# Patient Record
Sex: Female | Born: 1955 | Race: White | Hispanic: No | Marital: Married | Smoking: Never smoker
Health system: Southern US, Community
[De-identification: ages and names within clinical notes are randomized; demographics above are authoritative.]

## PROBLEM LIST (undated history)

## (undated) DIAGNOSIS — E785 Hyperlipidemia, unspecified: Secondary | ICD-10-CM

## (undated) DIAGNOSIS — L299 Pruritus, unspecified: Secondary | ICD-10-CM

## (undated) DIAGNOSIS — Z8614 Personal history of Methicillin resistant Staphylococcus aureus infection: Secondary | ICD-10-CM

## (undated) DIAGNOSIS — O021 Missed abortion: Secondary | ICD-10-CM

## (undated) DIAGNOSIS — L439 Lichen planus, unspecified: Secondary | ICD-10-CM

## (undated) DIAGNOSIS — M20099 Other deformity of finger(s), unspecified finger(s): Secondary | ICD-10-CM

## (undated) DIAGNOSIS — R7881 Bacteremia: Secondary | ICD-10-CM

## (undated) DIAGNOSIS — K449 Diaphragmatic hernia without obstruction or gangrene: Secondary | ICD-10-CM

## (undated) DIAGNOSIS — M1990 Unspecified osteoarthritis, unspecified site: Secondary | ICD-10-CM

## (undated) DIAGNOSIS — L03119 Cellulitis of unspecified part of limb: Secondary | ICD-10-CM

## (undated) HISTORY — DX: Personal history of Methicillin resistant Staphylococcus aureus infection: Z86.14

## (undated) HISTORY — DX: Cellulitis of unspecified part of limb: L03.119

## (undated) HISTORY — DX: Other deformity of finger(s), unspecified finger(s): M20.099

## (undated) HISTORY — DX: Pruritus, unspecified: L29.9

## (undated) HISTORY — PX: WISDOM TOOTH EXTRACTION: SHX21

## (undated) HISTORY — DX: Lichen planus, unspecified: L43.9

## (undated) HISTORY — DX: Diaphragmatic hernia without obstruction or gangrene: K44.9

## (undated) HISTORY — DX: Bacteremia: R78.81

## (undated) HISTORY — PX: DILATION AND CURETTAGE OF UTERUS: SHX78

## (undated) HISTORY — DX: Missed abortion: O02.1

## (undated) HISTORY — PX: CHOLECYSTECTOMY: SHX55

## (undated) HISTORY — PX: OTHER SURGICAL HISTORY: SHX169

---

## 2013-02-08 ENCOUNTER — Other Ambulatory Visit (HOSPITAL_COMMUNITY)
Admission: RE | Admit: 2013-02-08 | Discharge: 2013-02-08 | Disposition: A | Discharge status: Home / Self Care | Payer: Managed Care, Other (non HMO) | Source: Ambulatory Visit | Attending: Gynecology | Admitting: Gynecology

## 2013-02-08 ENCOUNTER — Encounter: Payer: Self-pay | Admitting: Gynecology

## 2013-02-08 ENCOUNTER — Ambulatory Visit (INDEPENDENT_AMBULATORY_CARE_PROVIDER_SITE_OTHER): Payer: Managed Care, Other (non HMO) | Admitting: Gynecology

## 2013-02-08 VITALS — BP 126/80 | Ht 66.0 in | Wt 180.0 lb

## 2013-02-08 DIAGNOSIS — Z01419 Encounter for gynecological examination (general) (routine) without abnormal findings: Secondary | ICD-10-CM

## 2013-02-08 DIAGNOSIS — N951 Menopausal and female climacteric states: Secondary | ICD-10-CM

## 2013-02-08 DIAGNOSIS — L900 Lichen sclerosus et atrophicus: Secondary | ICD-10-CM

## 2013-02-08 DIAGNOSIS — L940 Localized scleroderma [morphea]: Secondary | ICD-10-CM

## 2013-02-08 DIAGNOSIS — Z1151 Encounter for screening for human papillomavirus (HPV): Secondary | ICD-10-CM | POA: Insufficient documentation

## 2013-02-08 DIAGNOSIS — Z780 Asymptomatic menopausal state: Secondary | ICD-10-CM

## 2013-02-08 MED ORDER — CLOBETASOL PROPIONATE 0.05 % EX CREA: TOPICAL_CREAM | Freq: Two times a day (BID) | CUTANEOUS | Status: DC

## 2013-02-08 NOTE — Progress Notes (Signed)
Diane Price 04-13-1956 [id number]   History:    57 y.o.  for annual gyn exam new patient to the practice who moved here from [place]. Patient stated that she was being evaluated in [place] for recurrent vulvar sores. Patient several years ago was treated for MRSA infection in her back. She stated her gynecological examination was over a year ago. She states that she has always had normal Pap smears. Mammogram was normal over a year ago although she has history of dense breast. Her colonoscopy was 3 years ago reportedly normal. No prior bone density study. Her PCP is Dr. Darcus Austin who has been doing her lab work. Patient does not recall when she entered menopause but has never been on hormone replacement therapy. 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  Past medical history,surgical history, family history and social history were all reviewed and documented in the EPIC chart.  Gynecologic History No LMP recorded. Patient is postmenopausal. Contraception: post menopausal status Last Pap: over a year ago in [place]. Results were: patient reports that it was normal Last mammogram: greater than one year ago in [place]. Results were: normal but dense and had a right benign breast biopsy  Obstetric History OB History   Grav Para Term Preterm Abortions TAB SAB Ect Mult Living   4 1   3  3   1      # Outc Date GA Lbr Len/2nd Wgt Sex Del Anes PTL Lv   1 PAR            2 SAB            3 SAB            4 SAB                ROS: A ROS was performed and pertinent positives and negatives are included in the history.  GENERAL: No fevers or chills. HEENT: No change in vision, no earache, sore throat or sinus congestion. NECK: No pain or stiffness. CARDIOVASCULAR: No chest pain or pressure. No palpitations. PULMONARY: No shortness of breath, cough or wheeze. GASTROINTESTINAL: No abdominal pain, nausea,  vomiting or diarrhea, melena or bright red blood per rectum. GENITOURINARY: No urinary frequency, urgency, hesitancy or dysuria. MUSCULOSKELETAL: No joint or muscle pain, no back pain, no recent trauma. DERMATOLOGIC: irritated paper cut like areas lower part of the vagina and tenderENDOCRINE: No polyuria, polydipsia, no heat or cold intolerance. No recent change in weight. HEMATOLOGICAL: No anemia or easy bruising or bleeding. NEUROLOGIC: No headache, seizures, numbness, tingling or weakness. PSYCHIATRIC: No depression, no loss of interest in normal activity or change in sleep pattern.     Exam: chaperone present  BP 126/80  Ht 5\' 6"  (1.676 m)  Wt 180 lb (81.647 kg)  BMI 29.07 kg/m2  Body mass index is 29.07 kg/(m^2).  General appearance : Well developed well nourished female. No acute distress HEENT: Neck supple, trachea midline, no carotid bruits, no thyroidmegaly Lungs: Clear to auscultation, no rhonchi or wheezes, or rib retractions  Heart: Regular rate and rhythm, no murmurs or gallops Breast:Examined in sitting and supine position were symmetrical in appearance, no palpable masses or tenderness,  no skin retraction, no nipple inversion, no nipple discharge, no skin discoloration, no axillary or supraclavicular lymphadenopathy Abdomen: no palpable masses or tenderness, no rebound or guarding Extremities: no edema or skin discoloration or tenderness  Pelvic:  Bartholin,  Urethra, Skene Glands: Within normal limits.leukoplakic excoriated area of the fourchette suspicious for lichen sclerosis             Vagina: No gross lesions or discharge  Cervix: No gross lesions or discharge  Uterus  Small anteverted, normal size, shape and consistency, non-tender and mobile  Adnexa  Without masses or tenderness  Anus and perineum  normal   Rectovaginal  normal sphincter tone without palpated masses or tenderness             Hemoccult Hemoccult card provided     Assessment/Plan:  57 y.o. 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 Patient stated she has had biopsies of the area before. She is going to be placed on clobetasol 0.05% to apply twice a day for one week then 3 times a week and then followup to see me in one month. Hemoccult cards were provided for her to cement in the office. She will schedule a bone density as well as mammogram. Pap smear was done today. The new guidelines were discussed. We discussed importance of calcium and vitamin D in regular exercise for osteoporosis prevention.    Ok Edwards MD, 5:18 PM 02/08/2013

## 2013-02-08 NOTE — Patient Instructions (Addendum)
Tetanus, Diphtheria, Pertussis (Tdap) Vaccine What You Need to Know WHY GET VACCINATED? Tetanus, diphtheria and pertussis can be very serious diseases, even for adolescents and adults. Tdap vaccine can protect us from these diseases. TETANUS (Lockjaw) causes painful muscle tightening and stiffness, usually all over the body.  It can lead to tightening of muscles in the head and neck so you can't open your mouth, swallow, or sometimes even breathe. Tetanus kills about 1 out of 5 people who are infected. DIPHTHERIA can cause a thick coating to form in the back of the throat.  It can lead to breathing problems, paralysis, heart failure, and death. PERTUSSIS (Whooping Cough) causes severe coughing spells, which can cause difficulty breathing, vomiting and disturbed sleep.  It can also lead to weight loss, incontinence, and rib fractures. Up to 2 in 100 adolescents and 5 in 100 adults with pertussis are hospitalized or have complications, which could include pneumonia and death. These diseases are caused by bacteria. Diphtheria and pertussis are spread from person to person through coughing or sneezing. Tetanus enters the body through cuts, scratches, or wounds. Before vaccines, the [place] saw as many as 200,000 cases a year of diphtheria and pertussis, and hundreds of cases of tetanus. Since vaccination began, tetanus and diphtheria have dropped by about 99% and pertussis by about 80%. TDAP VACCINE Tdap vaccine can protect adolescents and adults from tetanus, diphtheria, and pertussis. One dose of Tdap is routinely given at age 11 or 12. People who did not get Tdap at that age should get it as soon as possible. Tdap is especially important for health care professionals and anyone having close contact with a baby younger than 12 months. Pregnant women should get a dose of Tdap during every pregnancy, to protect the newborn from pertussis. Infants are most at risk for severe, life-threatening  complications from pertussis. A similar vaccine, called Td, protects from tetanus and diphtheria, but not pertussis. A Td booster should be given every 10 years. Tdap may be given as one of these boosters if you have not already gotten a dose. Tdap may also be given after a severe cut or burn to prevent tetanus infection. Your doctor can give you more information. Tdap may safely be given at the same time as other vaccines. SOME PEOPLE SHOULD NOT GET THIS VACCINE  If you ever had a life-threatening allergic reaction after a dose of any tetanus, diphtheria, or pertussis containing vaccine, OR if you have a severe allergy to any part of this vaccine, you should not get Tdap. Tell your doctor if you have any severe allergies.  If you had a coma, or long or multiple seizures within 7 days after a childhood dose of DTP or DTaP, you should not get Tdap, unless a cause other than the vaccine was found. You can still get Td.  Talk to your doctor if you:  have epilepsy or another nervous system problem,  had severe pain or swelling after any vaccine containing diphtheria, tetanus or pertussis,  ever had Guillain-Barr Syndrome (GBS),  aren't feeling well on the day the shot is scheduled. RISKS OF A VACCINE REACTION With any medicine, including vaccines, there is a chance of side effects. These are usually mild and go away on their own, but serious reactions are also possible. Brief fainting spells can follow a vaccination, leading to injuries from falling. Sitting or lying down for about 15 minutes can help prevent these. Tell your doctor if you feel dizzy or light-headed, or   have vision changes or ringing in the ears. Mild problems following Tdap (Did not interfere with activities)  Pain where the shot was given (about 3 in 4 adolescents or 2 in 3 adults)  Redness or swelling where the shot was given (about 1 person in 5)  Mild fever of at least 100.67F (up to about 1 in 25 adolescents or 1 in  100 adults)  Headache (about 3 or 4 people in 10)  Tiredness (about 1 person in 3 or 4)  Nausea, vomiting, diarrhea, stomach ache (up to 1 in 4 adolescents or 1 in 10 adults)  Chills, body aches, sore joints, rash, swollen glands (uncommon) Moderate problems following Tdap (Interfered with activities, but did not require medical attention)  Pain where the shot was given (about 1 in 5 adolescents or 1 in 100 adults)  Redness or swelling where the shot was given (up to about 1 in 16 adolescents or 1 in 25 adults)  Fever over 102F (about 1 in 100 adolescents or 1 in 250 adults)  Headache (about 3 in 20 adolescents or 1 in 10 adults)  Nausea, vomiting, diarrhea, stomach ache (up to 1 or 3 people in 100)  Swelling of the entire arm where the shot was given (up to about 3 in 100). Severe problems following Tdap (Unable to perform usual activities, required medical attention)  Swelling, severe pain, bleeding and redness in the arm where the shot was given (rare). A severe allergic reaction could occur after any vaccine (estimated less than 1 in a million doses). WHAT IF THERE IS A SERIOUS REACTION? What should I look for?  Look for anything that concerns you, such as signs of a severe allergic reaction, very high fever, or behavior changes. Signs of a severe allergic reaction can include hives, swelling of the face and throat, difficulty breathing, a fast heartbeat, dizziness, and weakness. These would start a few minutes to a few hours after the vaccination. What should I do?  If you think it is a severe allergic reaction or other emergency that can't wait, call 9-1-1 or get the person to the nearest hospital. Otherwise, call your doctor.  Afterward, the reaction should be reported to the "Vaccine Adverse Event Reporting System" (VAERS). Your doctor might file this report, or you can do it yourself through the VAERS web site at www.vaers.LAgents.no, or by calling 1-343-547-6910. VAERS is  only for reporting reactions. They do not give medical advice.  THE NATIONAL VACCINE INJURY COMPENSATION PROGRAM The National Vaccine Injury Compensation Program (VICP) is a federal program that was created to compensate people who may have been injured by certain vaccines. Persons who believe they may have been injured by a vaccine can learn about the program and about filing a claim by calling 1-(867)198-2696 or visiting the VICP website at SpiritualWord.at. HOW CAN I LEARN MORE?  Ask your doctor.  Call your local or state health department.  Contact the Centers for Disease Control and Prevention (CDC):  Call 719-878-8993 or visit CDC's website at PicCapture.uy. CDC Tdap Vaccine VIS (11/14/11) Document Released: 12/24/2011 Document Revised: 03/18/2012 Document Reviewed: 12/24/2011 [employer] Patient Information 2014 [place], [place]. Lichen Sclerosus Lichen sclerosus is a skin problem. It can happen on any part of the body, but it commonly involves the anal or genital areas. Lichen sclerosus is not an infection or a fungus. Girls and women are more commonly affected than boys and men. CAUSES The cause is not known. It could be the result of an overactive immune  system or a lack of certain hormones. Lichen sclerosus is not passed from one person to another (not contagious). SYMPTOMS Your skin may have:  Thin, wrinkled, white areas.  Thickened white areas.  Red and swollen patches.  Tears or cracks.  Bruising.  Blood blisters.  Severe itching. You may also have pain, itching, or burning with urination. Constipation is also common in people with lichen sclerosus. DIAGNOSIS Your caregiver will do a physical exam. Sometimes, a tissue sample (biopsy) may be sent for testing. TREATMENT Treatment may involve putting a thin layer of medicated cream (topical steroid) over the areas with lichen sclerosus. Use the cream only as directed by your caregiver.  HOME CARE  INSTRUCTIONS  Only take over-the-counter or prescription medicines as directed by your caregiver.  Keep the vaginal area as clean and dry as possible. SEEK MEDICAL CARE IF: You develop increasing pain, swelling, or redness. Document Released: 11/14/2010 Document Revised: 09/16/2011 Document Reviewed: 11/14/2010 [facility] Patient Information 2014 [place], [place].

## 2013-02-10 ENCOUNTER — Telehealth: Payer: Self-pay

## 2013-02-10 NOTE — Telephone Encounter (Signed)
Patient called stating that the Rx Dr. Glenetta Hew prescribed was not available at her pharmacy. I called her pharmacy and they said they did not have it in stock and there was a manufacturer back order and they did not know when they would get it.  They had called around and found it for her and it has been filled about a mile away at [facility]  The pharmacist said she informed patient of this last night.  I called pt got her voice mail and told her to go ahead and get it there. It is drug of choice for her condition and it is available for her to pick up there and should go get it even if not her regular pharmacy.

## 2013-03-18 ENCOUNTER — Ambulatory Visit: Payer: Managed Care, Other (non HMO) | Admitting: Gynecology

## 2013-03-24 ENCOUNTER — Other Ambulatory Visit: Payer: Self-pay

## 2013-03-24 DIAGNOSIS — Z1231 Encounter for screening mammogram for malignant neoplasm of breast: Secondary | ICD-10-CM

## 2013-03-29 ENCOUNTER — Encounter: Payer: Self-pay | Admitting: Gynecology

## 2013-03-29 ENCOUNTER — Ambulatory Visit (INDEPENDENT_AMBULATORY_CARE_PROVIDER_SITE_OTHER): Payer: Managed Care, Other (non HMO) | Admitting: Gynecology

## 2013-03-29 DIAGNOSIS — L900 Lichen sclerosus et atrophicus: Secondary | ICD-10-CM

## 2013-03-29 DIAGNOSIS — N952 Postmenopausal atrophic vaginitis: Secondary | ICD-10-CM

## 2013-03-29 DIAGNOSIS — L940 Localized scleroderma [morphea]: Secondary | ICD-10-CM

## 2013-03-29 MED ORDER — NONFORMULARY OR COMPOUNDED ITEM: Status: DC

## 2013-03-29 MED ORDER — CLOBETASOL PROPIONATE 0.05 % EX CREA: TOPICAL_CREAM | CUTANEOUS | Status: AC

## 2013-03-29 NOTE — Patient Instructions (Addendum)
Hormone Therapy At menopause, your body begins making less estrogen and progesterone hormones. This causes the body to stop having menstrual periods. This is because estrogen and progesterone hormones control your periods and menstrual cycle. A lack of estrogen may cause symptoms such as:  Hot flushes (or hot flashes).  Vaginal dryness.  Dry skin.  Loss of sex drive.  Risk of bone loss (osteoporosis). When this happens, you may choose to take hormone therapy to get back the estrogen lost during menopause. When the hormone estrogen is given alone, it is usually referred to as ET (Estrogen Therapy). When the hormone progestin is combined with estrogen, it is generally called HT (Hormone Therapy). This was formerly known as hormone replacement therapy (HRT). Your caregiver can help you make a decision on what will be best for you. The decision to use HT seems to change often as new studies are done. Many studies do not agree on the benefits of hormone replacement therapy. LIKELY BENEFITS OF HT INCLUDE PROTECTION FROM:  Hot Flushes (also called hot flashes) - A hot flush is a sudden feeling of heat that spreads over the face and body. The skin may redden like a blush. It is connected with sweats and sleep disturbance. Women going through menopause may have hot flushes a few times a month or several times per day depending on the woman.  Osteoporosis (bone loss)- Estrogen helps guard against bone loss. After menopause, a woman's bones slowly lose calcium and become weak and brittle. As a result, bones are more likely to break. The hip, wrist, and spine are affected most often. Hormone therapy can help slow bone loss after menopause. Weight bearing exercise and taking calcium with vitamin D also can help prevent bone loss. There are also medications that your caregiver can prescribe that can help prevent osteoporosis.  Vaginal Dryness - Loss of estrogen causes changes in the vagina. Its lining may  become thin and dry. These changes can cause pain and bleeding during sexual intercourse. Dryness can also lead to infections. This can cause burning and itching. (Vaginal estrogen treatment can help relieve pain, itching, and dryness.)  Urinary Tract Infections are more common after menopause because of lack of estrogen. Some women also develop urinary incontinence because of low estrogen levels in the vagina and bladder.  Possible other benefits of estrogen include a positive effect on mood and short-term memory in women. RISKS AND COMPLICATIONS  Using estrogen alone without progesterone causes the lining of the uterus to grow. This increases the risk of lining of the uterus (endometrial) cancer. Your caregiver should give another hormone called progestin if you have a uterus.  Women who take combined (estrogen and progestin) HT appear to have an increased risk of breast cancer. The risk appears to be small, but increases throughout the time that HT is taken.  Combined therapy also makes the breast tissue slightly denser which makes it harder to read mammograms (breast X-rays).  Combined, estrogen and progesterone therapy can be taken together every day, in which case there may be spotting of blood. HT therapy can be taken cyclically in which case you will have menstrual periods. Cyclically means HT is taken for a set amount of days, then not taken, then this process is repeated.  HT may increase the risk of stroke, heart attack, breast cancer and forming blood clots in your leg.  Transdermal estrogen (estrogen that is absorbed through the skin with a patch or a cream) may have more positive results with:    Cholesterol.  Blood pressure.  Blood clots. Having the following conditions may indicate you should not have HT:  Endometrial cancer.  Liver disease.  Breast cancer.  Heart disease.  History of blood clots.  Stroke. TREATMENT   If you choose to take HT and have a uterus,  usually estrogen and progestin are prescribed.  Your caregiver will help you decide the best way to take the medications.  Possible ways to take estrogen include:  Pills.  Patches.  Gels.  Sprays.  Vaginal estrogen cream, rings and tablets.  It is best to take the lowest dose possible that will help your symptoms and take them for the shortest period of time that you can.  Hormone therapy can help relieve some of the problems (symptoms) that affect women at menopause. Before making a decision about HT, talk to your caregiver about what is best for you. Be well informed and comfortable with your decisions. HOME CARE INSTRUCTIONS   Follow your caregivers advice when taking the medications.  A Pap test is done to screen for cervical cancer.  The first Pap test should be done at age 38.  Between ages 62 and 36, Pap tests are repeated every 2 years.  Beginning at age 58, you are advised to have a Pap test every 3 years as long as your past 3 Pap tests have been normal.  Some women have medical problems that increase the chance of getting cervical cancer. Talk to your caregiver about these problems. It is especially important to talk to your caregiver if a new problem develops soon after your last Pap test. In these cases, your caregiver may recommend more frequent screening and Pap tests.  The above recommendations are the same for women who have or have not gotten the vaccine for HPV (Human Papillomavirus).  If you had a hysterectomy for a problem that was not a cancer or a condition that could lead to cancer, then you no longer need Pap tests. However, even if you no longer need a Pap test, a regular exam is a good idea to make sure no other problems are starting.   If you are between ages 26 and 73, and you have had normal Pap tests going back 10 years, you no longer need Pap tests. However, even if you no longer need a Pap test, a regular exam is a good idea to make sure no  other problems are starting.   If you have had past treatment for cervical cancer or a condition that could lead to cancer, you need Pap tests and screening for cancer for at least 20 years after your treatment.  If Pap tests have been discontinued, risk factors (such as a new sexual partner) need to be re-assessed to determine if screening should be resumed.  Some women may need screenings more often if they are at high risk for cervical cancer.  Get mammograms done as per the advice of your caregiver. SEEK IMMEDIATE MEDICAL CARE IF:  You develop abnormal vaginal bleeding.  You have pain or swelling in your legs, shortness of breath, or chest pain.  You develop dizziness or headaches.  You have lumps or changes in your breasts or armpits.  You have slurred speech.  You develop weakness or numbness of your arms or legs.  You have pain, burning, or bleeding when urinating.  You develop abdominal pain. Document Released: 03/23/2003 Document Revised: 09/16/2011 Document Reviewed: 07/11/2010 [facility] Patient Information 2014 [place], [place]. Lichen Sclerosus Lichen sclerosus is a  skin problem. It can happen on any part of the body, but it commonly involves the anal or genital areas. Lichen sclerosus is not an infection or a fungus. Girls and women are more commonly affected than boys and men. CAUSES The cause is not known. It could be the result of an overactive immune system or a lack of certain hormones. Lichen sclerosus is not passed from one person to another (not contagious). SYMPTOMS Your skin may have:  Thin, wrinkled, white areas.  Thickened white areas.  Red and swollen patches.  Tears or cracks.  Bruising.  Blood blisters.  Severe itching. You may also have pain, itching, or burning with urination. Constipation is also common in people with lichen sclerosus. DIAGNOSIS Your caregiver will do a physical exam. Sometimes, a tissue sample (biopsy) may be sent  for testing. TREATMENT Treatment may involve putting a thin layer of medicated cream (topical steroid) over the areas with lichen sclerosus. Use the cream only as directed by your caregiver.  HOME CARE INSTRUCTIONS  Only take over-the-counter or prescription medicines as directed by your caregiver.  Keep the vaginal area as clean and dry as possible. SEEK MEDICAL CARE IF: You develop increasing pain, swelling, or redness. Document Released: 11/14/2010 Document Revised: 09/16/2011 Document Reviewed: 11/14/2010 [facility] Patient Information 2014 [place], [place].

## 2013-03-29 NOTE — Progress Notes (Signed)
Patient presented to the office today for followup. She was seen in the office on August 4 is a new patient who had moved from [place]. Patient was being evaluated there by gynecologists and dermatologists as a result of her chronic recurrent vulvar sores. Patient several years ago was treated for MRSA infection in her back. She stated her gynecological examination was over a year ago. She states that she has always had normal Pap smears.Patient does not recall when she entered menopause but has never been on hormone replacement therapy. 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  Patient was started on lichen sclerosis 0.05% that she applied each bedtime for one week and 3 times a week and was instructed to return to the office for followup. Patient with prior biopsy in [place] stating that she had had lichen sclerosis at the area of the fourchette which had been tender on contact and slightly excoriated.  Exam: Bartholin urethra Skene was within normal limits the vaginal fourchette paper cut like area the leukoplakic area and significantly diminished from last month.  Assessment/plan: Patient will be placed on Estrace vaginal cream 3 times a week and in between twice a week to apply the clobetasol. She will do sitz baths every night. She'll return back to the office in 3 months for followup. If this continues we will need to do another biopsy to make sure the diagnosis is unchanged. Patient with no history of breast cancer reported mammograms are up-to-date.

## 2013-04-28 ENCOUNTER — Ambulatory Visit: Payer: Managed Care, Other (non HMO)

## 2013-04-28 ENCOUNTER — Other Ambulatory Visit: Payer: Managed Care, Other (non HMO)

## 2013-07-11 ENCOUNTER — Emergency Department (HOSPITAL_BASED_OUTPATIENT_CLINIC_OR_DEPARTMENT_OTHER): Payer: Managed Care, Other (non HMO)

## 2013-07-11 ENCOUNTER — Encounter (HOSPITAL_BASED_OUTPATIENT_CLINIC_OR_DEPARTMENT_OTHER): Payer: Self-pay | Admitting: Emergency Medicine

## 2013-07-11 ENCOUNTER — Emergency Department (HOSPITAL_BASED_OUTPATIENT_CLINIC_OR_DEPARTMENT_OTHER)
Admission: EM | Admit: 2013-07-11 | Discharge: 2013-07-11 | Disposition: A | Discharge status: Home / Self Care | Payer: Managed Care, Other (non HMO) | Attending: Emergency Medicine | Admitting: Emergency Medicine

## 2013-07-11 DIAGNOSIS — R109 Unspecified abdominal pain: Secondary | ICD-10-CM

## 2013-07-11 DIAGNOSIS — R079 Chest pain, unspecified: Secondary | ICD-10-CM | POA: Insufficient documentation

## 2013-07-11 DIAGNOSIS — R61 Generalized hyperhidrosis: Secondary | ICD-10-CM | POA: Insufficient documentation

## 2013-07-11 DIAGNOSIS — R1013 Epigastric pain: Secondary | ICD-10-CM | POA: Insufficient documentation

## 2013-07-11 DIAGNOSIS — R112 Nausea with vomiting, unspecified: Secondary | ICD-10-CM | POA: Insufficient documentation

## 2013-07-11 DIAGNOSIS — Z9089 Acquired absence of other organs: Secondary | ICD-10-CM | POA: Insufficient documentation

## 2013-07-11 DIAGNOSIS — Z8614 Personal history of Methicillin resistant Staphylococcus aureus infection: Secondary | ICD-10-CM | POA: Insufficient documentation

## 2013-07-11 DIAGNOSIS — Z8719 Personal history of other diseases of the digestive system: Secondary | ICD-10-CM | POA: Insufficient documentation

## 2013-07-11 DIAGNOSIS — Z8742 Personal history of other diseases of the female genital tract: Secondary | ICD-10-CM | POA: Insufficient documentation

## 2013-07-11 LAB — URINALYSIS, ROUTINE W REFLEX MICROSCOPIC
BILIRUBIN URINE: NEGATIVE
Glucose, UA: NEGATIVE mg/dL
Hgb urine dipstick: NEGATIVE
KETONES UR: NEGATIVE mg/dL
NITRITE: NEGATIVE
PROTEIN: NEGATIVE mg/dL
SPECIFIC GRAVITY, URINE: 1.008 (ref 1.005–1.030)
Urobilinogen, UA: 0.2 mg/dL (ref 0.0–1.0)
pH: 7.5 (ref 5.0–8.0)

## 2013-07-11 LAB — CBC WITH DIFFERENTIAL/PLATELET
Basophils Absolute: 0 10*3/uL (ref 0.0–0.1)
Basophils Relative: 0 % (ref 0–1)
EOS ABS: 0 10*3/uL (ref 0.0–0.7)
Eosinophils Relative: 0 % (ref 0–5)
HCT: 38.9 % (ref 36.0–46.0)
HEMOGLOBIN: 13.2 g/dL (ref 12.0–15.0)
Lymphocytes Relative: 15 % (ref 12–46)
Lymphs Abs: 1.4 10*3/uL (ref 0.7–4.0)
MCH: 32.1 pg (ref 26.0–34.0)
MCHC: 33.9 g/dL (ref 30.0–36.0)
MCV: 94.6 fL (ref 78.0–100.0)
MONOS PCT: 4 % (ref 3–12)
Monocytes Absolute: 0.4 10*3/uL (ref 0.1–1.0)
NEUTROS PCT: 80 % — AB (ref 43–77)
Neutro Abs: 7 10*3/uL (ref 1.7–7.7)
Platelets: 233 10*3/uL (ref 150–400)
RBC: 4.11 MIL/uL (ref 3.87–5.11)
RDW: 12.2 % (ref 11.5–15.5)
WBC: 8.8 10*3/uL (ref 4.0–10.5)

## 2013-07-11 LAB — COMPREHENSIVE METABOLIC PANEL
ALT: 29 U/L (ref 0–35)
AST: 29 U/L (ref 0–37)
Albumin: 4.3 g/dL (ref 3.5–5.2)
Alkaline Phosphatase: 119 U/L — ABNORMAL HIGH (ref 39–117)
BILIRUBIN TOTAL: 0.4 mg/dL (ref 0.3–1.2)
BUN: 15 mg/dL (ref 6–23)
CO2: 28 mEq/L (ref 19–32)
Calcium: 9.4 mg/dL (ref 8.4–10.5)
Chloride: 100 mEq/L (ref 96–112)
Creatinine, Ser: 1 mg/dL (ref 0.50–1.10)
GFR calc Af Amer: 71 mL/min — ABNORMAL LOW (ref >90)
GFR calc non Af Amer: 61 mL/min — ABNORMAL LOW (ref >90)
Glucose, Bld: 93 mg/dL (ref 70–99)
Potassium: 4.5 mEq/L (ref 3.7–5.3)
Sodium: 141 mEq/L (ref 137–147)
TOTAL PROTEIN: 7.6 g/dL (ref 6.0–8.3)

## 2013-07-11 LAB — LIPASE, BLOOD: LIPASE: 31 U/L (ref 11–59)

## 2013-07-11 LAB — URINE MICROSCOPIC-ADD ON

## 2013-07-11 MED ORDER — FAMOTIDINE IN NACL 20-0.9 MG/50ML-% IV SOLN
20.0000 mg | Freq: Once | INTRAVENOUS | Status: AC
  Administered 2013-07-11: 20 mg via INTRAVENOUS
  Filled 2013-07-11: qty 50

## 2013-07-11 MED ORDER — OMEPRAZOLE 20 MG PO CPDR: 20.0000 mg | DELAYED_RELEASE_CAPSULE | Freq: Every day | ORAL | Status: DC

## 2013-07-11 MED ORDER — ONDANSETRON HCL 4 MG/2ML IJ SOLN
4.0000 mg | Freq: Once | INTRAMUSCULAR | Status: AC
  Administered 2013-07-11: 4 mg via INTRAVENOUS
  Filled 2013-07-11: qty 2

## 2013-07-11 MED ORDER — GI COCKTAIL ~~LOC~~
30.0000 mL | Freq: Once | ORAL | Status: AC
  Administered 2013-07-11: 30 mL via ORAL
  Filled 2013-07-11: qty 30

## 2013-07-11 MED ORDER — SUCRALFATE 1 G PO TABS: 1.0000 g | ORAL_TABLET | Freq: Four times a day (QID) | ORAL | Status: DC

## 2013-07-11 MED ORDER — HYDROMORPHONE HCL PF 1 MG/ML IJ SOLN
0.5000 mg | Freq: Once | INTRAMUSCULAR | Status: AC
  Administered 2013-07-11: 0.5 mg via INTRAVENOUS
  Filled 2013-07-11: qty 1

## 2013-07-11 MED ORDER — SODIUM CHLORIDE 0.9 % IV SOLN
1000.0000 mL | Freq: Once | INTRAVENOUS | Status: AC
  Administered 2013-07-11: 1000 mL via INTRAVENOUS

## 2013-07-11 NOTE — ED Provider Notes (Signed)
CSN: [id number]     Arrival date & time 07/11/13  1348 History  This chart was scribed for Gerhard Munch, MD by Leone Payor, ED Scribe. This patient was seen in room MH01/MH01 and the patient's care was started 3:50 PM.    Chief Complaint  Patient presents with  . Abdominal Pain  . Chest Pain    The history is provided by the patient. No language interpreter was used.    HPI Comments: Diane Price is a 58 y.o. female with past medical history of GERD, IBS, spastic colon who presents to the Emergency Department complaining of constant, mild to moderate epigastric pain that radiates to the lower abdomen and lower chest. She reports having diaphoresis and 1 episode of emesis that consisted of bile. She describes this pain as cramping all over and shooting to the left epigastric region. She has taken Alka Seltzer without relief. She states pain is worse with bending over. She has a history of cholecystectomy. She denies fever, LOC, confusion.     Past Medical History  Diagnosis Date  . Missed ab     x2  . Hiatal hernia   . History of MRSA infection    Past Surgical History  Procedure Laterality Date  . Cholecystectomy    . Dilation and curettage of uterus     Family History  Problem Relation Age of Onset  . Hypertension Mother   . Stroke Mother   . Heart disease Father    History  Substance Use Topics  . Smoking status: Never Smoker   . Smokeless tobacco: Never Used  . Alcohol Use: Yes     Comment: wine once a week   OB History   Grav Para Term Preterm Abortions TAB SAB Ect Mult Living   4 1   3  3   1      Review of Systems  Constitutional: Positive for diaphoresis. Negative for fever.       Per HPI, otherwise negative  HENT:       Per HPI, otherwise negative  Respiratory:       Per HPI, otherwise negative  Cardiovascular:       Per HPI, otherwise negative  Gastrointestinal: Positive for nausea, vomiting and abdominal pain.  Endocrine:       Negative aside  from HPI  Genitourinary:       Neg aside from HPI   Musculoskeletal:       Per HPI, otherwise negative  Skin: Negative.   Neurological: Negative for syncope.  Psychiatric/Behavioral: Negative for confusion.    Allergies  Review of patient's allergies indicates no known allergies.  Home Medications   Current Outpatient Rx  Name  Route  Sig  Dispense  Refill  . clobetasol cream (TEMOVATE) 0.05 %      Apply twice a week   30 g   5   . hydrocortisone 1 % ointment   Topical   Apply topically 2 (two) times daily.         . NONFORMULARY OR COMPOUNDED ITEM      Estradiol .02% 1 ML Prefilled Applicator Sig: apply vaginally twice a week #90 Day Supply with 4 refills   1 each   4    BP 130/81  Pulse 98  Temp(Src) 98.7 F (37.1 C) (Oral)  Resp 18  Ht 5\' 7"  (1.702 m)  Wt 180 lb (81.647 kg)  BMI 28.19 kg/m2  SpO2 98% Physical Exam  Nursing note and vitals reviewed. Constitutional: She is  oriented to person, place, and time. She appears well-developed and well-nourished. No distress.  HENT:  Head: Normocephalic and atraumatic.  Eyes: Conjunctivae and EOM are normal.  Cardiovascular: Normal rate, regular rhythm and normal heart sounds.   Pulmonary/Chest: Effort normal and breath sounds normal. No stridor. No respiratory distress.  Abdominal: Soft. She exhibits no distension. There is tenderness (Most tender on lateral aspect of the abdomen ). There is no rebound.  Non-peritoneal abdomen.   Musculoskeletal: She exhibits no edema.  Neurological: She is alert and oriented to person, place, and time. No cranial nerve deficit.  Skin: Skin is warm and dry.  Psychiatric: She has a normal mood and affect.    ED Course  Procedures (including critical care time)  DIAGNOSTIC STUDIES: Oxygen Saturation is 98% on RA, normal by my interpretation.    COORDINATION OF CARE: 3:56 PM Will order CXR, CBC, CMP, Lipase, UA. Discussed treatment plan with pt at bedside and pt agreed  to plan.  6:39 PM Discussed negative lab results with patient. Pt understands plan for discharge.  I discussed the utility of CT scan given her persistent mild discomfort.  She will pursue this as an outpatient, per her request.  Medications  ondansetron (ZOFRAN) injection 4 mg (4 mg Intravenous Given 07/11/13 1654)  0.9 %  sodium chloride infusion (1,000 mLs Intravenous New Bag/Given 07/11/13 1653)  HYDROmorphone (DILAUDID) injection 0.5 mg (0.5 mg Intravenous Given 07/11/13 1655)  gi cocktail (Maalox,Lidocaine,Donnatal) (30 mLs Oral Given 07/11/13 1633)  famotidine (PEPCID) IVPB 20 mg (20 mg Intravenous New Bag/Given 07/11/13 1700)      Labs Review Labs Reviewed  CBC WITH DIFFERENTIAL - Abnormal; Notable for the following:    Neutrophils Relative % 80 (*)    All other components within normal limits  COMPREHENSIVE METABOLIC PANEL - Abnormal; Notable for the following:    Alkaline Phosphatase 119 (*)    GFR calc non Af Amer 61 (*)    GFR calc Af Amer 71 (*)    All other components within normal limits  URINALYSIS, ROUTINE W REFLEX MICROSCOPIC - Abnormal; Notable for the following:    Leukocytes, UA MODERATE (*)    All other components within normal limits  URINE MICROSCOPIC-ADD ON - Abnormal; Notable for the following:    Squamous Epithelial / LPF FEW (*)    Bacteria, UA MANY (*)    All other components within normal limits  URINE CULTURE  LIPASE, BLOOD   Imaging Review Dg Chest 2 View  07/11/2013   CLINICAL DATA:  Chest pain.  EXAM: CHEST  2 VIEW  COMPARISON:  None.  FINDINGS: The heart size and mediastinal contours are within normal limits. Both lungs are clear. The visualized skeletal structures are unremarkable.  IMPRESSION: No active cardiopulmonary disease.   Electronically Signed   By: Loralie Champagne M.D.   On: 07/11/2013 14:54    EKG Interpretation    Date/Time:  Sunday July 11 2013 14:21:40 EST Ventricular Rate:  84 PR Interval:  140 QRS Duration: 78 QT  Interval:  380 QTC Calculation: 449 R Axis:   46 Text Interpretation:  Normal sinus rhythm Normal ECG No prior to compare to Confirmed by Gwendolyn Grant  MD, BLAIR ([id number]) on 07/11/2013 2:47:28 PM            MDM  Patient presents with abdominal pain.  Over, the patient has pain in her upper abdomen and left upper abdomen, nausea.  The patient has no peritonitis, no fever, no evidence of distress and  improved substantially after initial analgesia here.  The patient is prior episodes occur from her some suspicion of gastroesophageal pathology.  Absent ongoing distress, there is low concern for occult systemic infection or etiology.  She was discharged in stable condition to follow up with gastroenterology after empiric initiation of antacid therapy.  I personally performed the services described in this documentation, which was scribed in my presence. The recorded information has been reviewed and is accurate.    Gerhard Munchobert Donis Pinder, MD 07/11/13 [phone]

## 2013-07-11 NOTE — Discharge Instructions (Signed)
As discussed, your discomfort is likely due to irritation of your stomach and esophagus.  However, with your discomfort is important that you monitor your condition carefully, and do not hesitate to return here if you develop new, or concerning changes in your condition.  Otherwise, please be sure to follow up with our gastroenterologist.

## 2013-07-11 NOTE — ED Notes (Signed)
Pt reports epigastric pain radiating to lower abdomen and chest, associated with diaphoresis and vomting.  States she has issues with "esophagus".  Pain unrelieved after Alka Seltzer.

## 2013-07-13 LAB — URINE CULTURE: Colony Count: 30000

## 2014-05-09 ENCOUNTER — Encounter (HOSPITAL_BASED_OUTPATIENT_CLINIC_OR_DEPARTMENT_OTHER): Payer: Self-pay | Admitting: Emergency Medicine

## 2015-07-19 ENCOUNTER — Ambulatory Visit: Payer: Managed Care, Other (non HMO) | Admitting: Infectious Disease

## 2016-01-10 ENCOUNTER — Encounter: Payer: Self-pay | Admitting: Infectious Disease

## 2016-01-10 ENCOUNTER — Ambulatory Visit (INDEPENDENT_AMBULATORY_CARE_PROVIDER_SITE_OTHER): Payer: 59 | Admitting: Infectious Disease

## 2016-01-10 VITALS — BP 141/82 | HR 84 | Temp 98.1°F | Wt 174.0 lb

## 2016-01-10 DIAGNOSIS — M20091 Other deformity of right finger(s): Secondary | ICD-10-CM

## 2016-01-10 DIAGNOSIS — L03119 Cellulitis of unspecified part of limb: Secondary | ICD-10-CM

## 2016-01-10 DIAGNOSIS — R7881 Bacteremia: Secondary | ICD-10-CM

## 2016-01-10 DIAGNOSIS — L299 Pruritus, unspecified: Secondary | ICD-10-CM | POA: Insufficient documentation

## 2016-01-10 DIAGNOSIS — M20092 Other deformity of left finger(s): Secondary | ICD-10-CM

## 2016-01-10 DIAGNOSIS — B9562 Methicillin resistant Staphylococcus aureus infection as the cause of diseases classified elsewhere: Secondary | ICD-10-CM

## 2016-01-10 DIAGNOSIS — L439 Lichen planus, unspecified: Secondary | ICD-10-CM | POA: Diagnosis not present

## 2016-01-10 DIAGNOSIS — M20099 Other deformity of finger(s), unspecified finger(s): Secondary | ICD-10-CM

## 2016-01-10 HISTORY — DX: Methicillin resistant Staphylococcus aureus infection as the cause of diseases classified elsewhere: B95.62

## 2016-01-10 HISTORY — DX: Pruritus, unspecified: L29.9

## 2016-01-10 HISTORY — DX: Cellulitis of unspecified part of limb: L03.119

## 2016-01-10 HISTORY — DX: Bacteremia: R78.81

## 2016-01-10 HISTORY — DX: Other deformity of finger(s), unspecified finger(s): M20.099

## 2016-01-10 HISTORY — DX: Lichen planus, unspecified: L43.9

## 2016-01-10 NOTE — Progress Notes (Signed)
Reason for consult: diffuse recurrent pruritic lesions  Requesting Physician: Dr. Louretta Shorten  Subjective:    Patient ID: Diane Price, female    DOB: 01/20/1956, 60 y.o.   MRN: [id number]  HPI  60 year old with hx of recurrent cellulitis.   8 years ago she had bloodstream infection she believes with MRSA after steroid shot IM in buttocks for poison ivy. She had recurrent MRSA bacteremia after having completed IV abx. The recurrence was due to an undrained abscess in buttocks.   This was at [facility] in [place].  She was sent to [facility] for workup for recurring issues with pruritic skin problem in vaginal area and was seen by Gynecologist.   This was treated with steroids (clobetasol).   In addition to this she has had recurrent rashes on her shins.   It is my understanding that the vulvar lesion is thought to be due to lichen planus though I wonder IF all of her skin lesions are a manifestation of this condition.   They are characterized by intense pruritis firstly and at times become infected but secondarily so.  She attributes the onset of these skin lesions to the steroid injection followed by MRSA bacteremia 8 years ago.  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 Missed ab     x2  . Hiatal hernia   . History of MRSA infection   . Recurrent cellulitis of lower leg 01/10/2016  . Generalized pruritus 01/10/2016  . 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 Lichen planus 01/10/1606    Past Surgical History  Procedure Laterality Date  . Cholecystectomy    . Dilation and curettage of uterus      Family History  Problem Relation Age of Onset  . Hypertension Mother   . Stroke Mother   . Heart disease Father       Social History     Social History  . Marital Status: Married    Spouse Name: N/A  . Number of Children: N/A  . Years of Education: N/A   Social History Main Topics  . Smoking status: Never Smoker   . Smokeless tobacco: Never Used  . Alcohol Use: Yes     Comment: wine once a week  . Drug Use: No  . Sexual Activity: Not Asked   Other Topics Concern  . None   Social History Narrative    No Known Allergies   Current outpatient prescriptions:  .  clobetasol cream (TEMOVATE) 0.05 %, Apply twice a week, Disp: 30 g, Rfl: 5 .  doxycycline (MONODOX) 100 MG capsule, Take 100 mg by mouth 2 (two) times daily., Disp: , Rfl:  .  hydrOXYzine (ATARAX/VISTARIL) 25 MG tablet, Take 25 mg by mouth 3 (three) times daily as needed., Disp: , Rfl:  .  omeprazole (PRILOSEC) 20 MG capsule, Take 1 capsule (20 mg total) by mouth daily., Disp: 30 capsule, Rfl: 0 .  hydrocortisone 1 % ointment, Apply topically 2 (two) times daily. Reported on 01/10/2016, Disp: , Rfl:  .  NONFORMULARY OR COMPOUNDED ITEM, Estradiol .02% 1 ML Prefilled Applicator Sig: apply vaginally twice a week #90 Day Supply with 4 refills, Disp: 1 each, Rfl: 4 .  sucralfate (CARAFATE) 1 G tablet, Take  1 tablet (1 g total) by mouth 4 (four) times daily. (Patient not taking: Reported on 01/10/2016), Disp: 28 tablet, Rfl: 0     Review of Systems  Constitutional: Negative for fever, chills, diaphoresis, activity change, appetite change, fatigue and unexpected weight change.  HENT: Negative for congestion, rhinorrhea, sinus pressure, sneezing, sore throat and trouble swallowing.   Eyes: Negative for photophobia and visual disturbance.  Respiratory: Negative for cough, chest tightness, shortness of breath, wheezing and stridor.   Cardiovascular: Negative for chest pain, palpitations and leg swelling.  Gastrointestinal: Negative for nausea, vomiting, abdominal pain, diarrhea, constipation, blood in stool, abdominal distention and anal bleeding.  Genitourinary:  Negative for dysuria, hematuria, flank pain and difficulty urinating.  Musculoskeletal: Negative for myalgias, back pain, joint swelling, arthralgias and gait problem.  Skin: Positive for color change and rash. Negative for pallor and wound.  Neurological: Negative for dizziness, tremors, weakness and light-headedness.  Hematological: Negative for adenopathy. Does not bruise/bleed easily.  Psychiatric/Behavioral: Negative for behavioral problems, confusion, sleep disturbance, dysphoric mood, decreased concentration and agitation. The patient is nervous/anxious.        Objective:   Physical Exam  Constitutional: She is oriented to person, place, and time. She appears well-developed and well-nourished. No distress.  HENT:  Head: Normocephalic and atraumatic.  Mouth/Throat: No oropharyngeal exudate.  Eyes: Conjunctivae and EOM are normal. No scleral icterus.  Neck: Normal range of motion. Neck supple.  Cardiovascular: Normal rate and regular rhythm.   Pulmonary/Chest: Effort normal. No respiratory distress. She has no wheezes.  Abdominal: She exhibits no distension.  Musculoskeletal: She exhibits no edema or tenderness.  Neurological: She is alert and oriented to person, place, and time. She exhibits normal muscle tone. Coordination normal.  Skin: Skin is warm and dry. Rash noted. She is not diaphoretic. No erythema. No pallor.  Psychiatric: Her behavior is normal. Judgment and thought content normal. She exhibits a depressed mood.    GU: I did not examine the vulvar lesion. She has no lesions on gums  Skin pathology as below on legs   01/10/16:     Buttocks:     Hands ulnar devation of some of distal joints          Assessment & Plan:    #1 Intense pruritic skin disorder with lesions that appear on her legs, arms, sites of trauma (she has stopped shaving due to this)  Upon further review I wonder if this along with her vulvar lesion are simply ALL due to lichen  planus  I certainly in now way think that her pathology is being driven by a primary ID issue. I would not doubt that she may succumb to secondary bacterial infection from her excoriation of these lesions  I will do some auto immune labs including RA (ANA done by PCP), ESR, CRP, anti DS ab, anti DNA ab, SSA/SSB, ANCA. I will check HIV ab, hepatitis panel and RPR  It would be helpful to obtain records from her Dermatologist  I will also refer her to Rheumatology  The more I think about her case the more I think that lichen planus is the underlying pathology and that targeted treatment for that is warranted with more topical steroids, perhaps systemic steroids, UV light etc. There is an association with HCV as well  I encouraged her to take daily zyrtec for nonsedating anti histamine in addition to her atarax  #2 MSK abnormalities: see above  #3 Prior MRSA bacteremia and abscess: while this is not surprising  I doubt this specific incident has anything to do with her primary problem NOW  I spent greater than 60 minutes with the patient including greater than 50% of time in face to face counsel of the patient re her pruritic lesions, prior MRSA infection, bacteremia, MSK problem and in coordination of their care.

## 2016-01-11 LAB — HEPATITIS PANEL, ACUTE
HCV Ab: NEGATIVE
HEP B S AG: NEGATIVE
Hep A IgM: NONREACTIVE
Hep B C IgM: NONREACTIVE

## 2016-01-11 LAB — ANCA SCREEN W REFLEX TITER: ANCA SCREEN: NEGATIVE

## 2016-01-11 LAB — C-REACTIVE PROTEIN: CRP: <0.5 mg/dL (ref <0.60)

## 2016-01-11 LAB — SEDIMENTATION RATE: SED RATE: 6 mm/h (ref 0–30)

## 2016-01-11 LAB — SJOGRENS SYNDROME-B EXTRACTABLE NUCLEAR ANTIBODY: SSB (LA) (ENA) ANTIBODY, IGG: NEGATIVE

## 2016-01-11 LAB — HIV ANTIBODY (ROUTINE TESTING W REFLEX): HIV: NONREACTIVE

## 2016-01-11 LAB — RHEUMATOID FACTOR: Rhuematoid fact SerPl-aCnc: <10 IU/mL (ref <=14)

## 2016-01-11 LAB — ANTI-DNA ANTIBODY, DOUBLE-STRANDED: ds DNA Ab: <1 IU/mL

## 2016-01-11 LAB — SJOGRENS SYNDROME-A EXTRACTABLE NUCLEAR ANTIBODY: SSA (Ro) (ENA) Antibody, IgG: <1

## 2016-01-15 ENCOUNTER — Telehealth: Payer: Self-pay

## 2016-01-15 NOTE — Telephone Encounter (Signed)
Fax[facility] [phone]. Rejeana Brockandace Nannie Starzyk, LPN

## 2016-02-21 ENCOUNTER — Encounter: Payer: Self-pay | Admitting: Infectious Disease

## 2016-02-21 ENCOUNTER — Ambulatory Visit (INDEPENDENT_AMBULATORY_CARE_PROVIDER_SITE_OTHER): Payer: 59 | Admitting: Infectious Disease

## 2016-02-21 VITALS — BP 124/80 | HR 79 | Temp 98.7°F | Ht 67.0 in | Wt 180.5 lb

## 2016-02-21 DIAGNOSIS — B9562 Methicillin resistant Staphylococcus aureus infection as the cause of diseases classified elsewhere: Secondary | ICD-10-CM

## 2016-02-21 DIAGNOSIS — L299 Pruritus, unspecified: Secondary | ICD-10-CM | POA: Diagnosis not present

## 2016-02-21 DIAGNOSIS — L03119 Cellulitis of unspecified part of limb: Secondary | ICD-10-CM

## 2016-02-21 DIAGNOSIS — L900 Lichen sclerosus et atrophicus: Secondary | ICD-10-CM

## 2016-02-21 DIAGNOSIS — R7881 Bacteremia: Secondary | ICD-10-CM | POA: Diagnosis not present

## 2016-02-21 DIAGNOSIS — L439 Lichen planus, unspecified: Secondary | ICD-10-CM

## 2016-02-21 NOTE — Progress Notes (Signed)
Subjective:   Patient still with recurring skin lesions   Patient ID: Diane Price, female    DOB: 03/10/1956, 60 y.o.   MRN: [id number]  HPI  60 year old with hx of recurrent  Skin pathologyl.  8 years ago she had bloodstream infection she believes with MRSA after steroid shot IM in buttocks for poison ivy.  She had recurrent MRSA bacteremia after having completed IV abx. The recurrence was due to an undrained abscess in buttocks.   This was at [facility] [place].  [facility] for recurring issues with pruritic skin problem in vaginal area and was seen by Gynecologist.   This was treated with steroids (clobetasol).   In addition to this she has had recurrent rashes on her shins.   It is my understanding that the vulvar lesion is thought to be due to lichen planus though I wonder IF all of her skin lesions are a manifestation of this condition.   They are characterized by intense pruritis firstly and at times become infected but secondarily so.  She attributes the onset of these skin lesions to the steroid injection followed by MRSA bacteremia 8 years ago.  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 Her pruritis is slightly better since I suggested that she take zyrtec daily and she is taking antihistamine at night.   She continues to have cycles of apparent superinfection after scratching open lesions on her legs. She is using chronic CLOBEX on them as well.   I thinks she should see a Dermatologist at an academic center to spend some more time working on controlling the pruritis and searching for  potential underlying cause. Perhaps an allergist might help as well.   Past Medical History:  Diagnosis Date  . Generalized pruritus 01/10/2016  . Hiatal hernia   . History of MRSA infection   . Lichen planus 01/10/2016  . Missed ab    x2  . MRSA bacteremia 01/10/2016  . Recurrent cellulitis of lower leg 01/10/2016  . 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 CHOLECYSTECTOMY    . DILATION AND CURETTAGE OF UTERUS      Family History  Problem Relation Age of Onset  . Hypertension Mother   . Stroke Mother   . Heart disease Father       Social History   Social History  . Marital status: Married    Spouse name: N/A  . Number of children: N/A  . Years of education: N/A   Social History Main Topics  . Smoking status: Never Smoker  . Smokeless tobacco: Never Used  . Alcohol use Yes     Comment: wine once a week  . Drug use: No  . Sexual activity: Not on file   Other Topics Concern  . Not on file   Social History Narrative  . No narrative on file    No Known Allergies   Current Outpatient Prescriptions:  .  clobetasol cream (TEMOVATE) 0.05 %, Apply twice a week, Disp: 30 g, Rfl: 5 .  doxycycline (MONODOX) 100 MG capsule, Take 100 mg by mouth 2 (two) times daily., Disp: , Rfl:  .  hydrocortisone 1 % ointment, Apply topically 2 (two) times daily. Reported on 01/10/2016, Disp: , Rfl:  .  hydrOXYzine (ATARAX/VISTARIL) 25 MG tablet, Take 25 mg by mouth 3 (three) times daily as needed., Disp: , Rfl:  .  NONFORMULARY OR COMPOUNDED ITEM, Estradiol .02% 1 ML Prefilled Applicator Sig: apply vaginally twice a week #90 Day Supply with 4 refills, Disp: 1 each, Rfl: 4 .  omeprazole (PRILOSEC) 20 MG capsule, Take 1 capsule (20 mg total) by mouth daily., Disp: 30 capsule, Rfl: 0 .  sucralfate (CARAFATE) 1 G tablet, Take 1 tablet (1 g total) by mouth 4 (four) times daily. (Patient not taking: Reported on 01/10/2016), Disp: 28 tablet, Rfl:  0     Review of Systems  Constitutional: Negative for activity change, appetite change, chills, diaphoresis, fatigue, fever and unexpected weight change.  HENT: Negative for congestion, rhinorrhea, sinus pressure, sneezing, sore throat and trouble swallowing.   Eyes: Negative for photophobia and visual disturbance.  Respiratory: Negative for cough, chest tightness, shortness of breath, wheezing and stridor.   Cardiovascular: Negative for chest pain, palpitations and leg swelling.  Gastrointestinal: Negative for abdominal distention, abdominal pain, anal bleeding, blood in stool, constipation, diarrhea, nausea and vomiting.  Genitourinary: Negative for difficulty urinating, dysuria, flank pain and hematuria.  Musculoskeletal: Negative for arthralgias, back pain, gait problem, joint swelling and myalgias.  Skin: Positive for color change and rash. Negative for pallor and wound.  Neurological: Negative for dizziness, tremors, weakness and light-headedness.  Hematological: Negative for adenopathy. Does not bruise/bleed easily.  Psychiatric/Behavioral: Negative for agitation, behavioral problems, confusion, decreased concentration, dysphoric mood and sleep disturbance. The patient is nervous/anxious.        Objective:   Physical Exam  Constitutional: She is oriented to person, place, and time. She appears well-developed and well-nourished. No distress.  HENT:  Head: Normocephalic and atraumatic.  Mouth/Throat: No oropharyngeal exudate.  Eyes: Conjunctivae and EOM are normal. No scleral icterus.  Neck: Normal range of motion. Neck supple.  Cardiovascular: Normal rate and regular rhythm.   Pulmonary/Chest: Effort normal. No respiratory distress. She has no wheezes.  Abdominal: She exhibits no distension.  Musculoskeletal: She exhibits no edema or tenderness.  Neurological: She is alert and oriented to person, place, and time. She exhibits normal muscle tone. Coordination normal.  Skin:  Skin is warm and dry. Rash noted. She is not diaphoretic. No erythema. No pallor.  Psychiatric: Her behavior is normal. Judgment and thought content normal. She exhibits a depressed mood.    GU: I did not examine the vulvar lesion. She has no lesions on gums  Skin pathology as below on legs   01/10/16:      Legs 02/21/16:          Assessment & Plan:    #1 Intense pruritic skin disorder with lesions that appear on her legs, arms, sites of trauma (she has stopped shaving due to this)  Upon further review I wonder if this along with her vulvar lesion are simply ALL due to lichen planus  I certainly in now way think that her pathology is being driven by a primary ID issue. I would not doubt that she may succumb to secondary bacterial infection from her excoriation of these lesions   As above I am suggesting that she see a Dermatologist at academic center and [facility]  I spent greater  than 40 minutes with the patient including greater than 50% of time in face to face counsel of the patient re her pruritic lesions, prior MRSA infection, bacteremia,  and in coordination of her care.

## 2016-12-03 ENCOUNTER — Other Ambulatory Visit: Payer: Self-pay | Admitting: Family Medicine

## 2016-12-03 ENCOUNTER — Ambulatory Visit
Admission: RE | Admit: 2016-12-03 | Discharge: 2016-12-03 | Disposition: A | Discharge status: Home / Self Care | Payer: 59 | Source: Ambulatory Visit | Attending: Family Medicine | Admitting: Family Medicine

## 2016-12-03 DIAGNOSIS — S299XXA Unspecified injury of thorax, initial encounter: Secondary | ICD-10-CM

## 2017-05-12 DIAGNOSIS — R17 Unspecified jaundice: Secondary | ICD-10-CM | POA: Diagnosis not present

## 2017-05-12 DIAGNOSIS — R509 Fever, unspecified: Secondary | ICD-10-CM | POA: Diagnosis not present

## 2017-05-12 DIAGNOSIS — R300 Dysuria: Secondary | ICD-10-CM | POA: Diagnosis not present

## 2017-05-12 DIAGNOSIS — R05 Cough: Secondary | ICD-10-CM | POA: Diagnosis not present

## 2017-05-12 DIAGNOSIS — R82998 Other abnormal findings in urine: Secondary | ICD-10-CM | POA: Diagnosis not present

## 2017-05-15 DIAGNOSIS — K759 Inflammatory liver disease, unspecified: Secondary | ICD-10-CM | POA: Diagnosis not present

## 2017-05-15 DIAGNOSIS — R05 Cough: Secondary | ICD-10-CM | POA: Diagnosis not present

## 2017-05-22 DIAGNOSIS — R7301 Impaired fasting glucose: Secondary | ICD-10-CM | POA: Diagnosis not present

## 2017-05-22 DIAGNOSIS — R740 Nonspecific elevation of levels of transaminase and lactic acid dehydrogenase [LDH]: Secondary | ICD-10-CM | POA: Diagnosis not present

## 2017-05-22 DIAGNOSIS — R05 Cough: Secondary | ICD-10-CM | POA: Diagnosis not present

## 2017-05-22 DIAGNOSIS — K759 Inflammatory liver disease, unspecified: Secondary | ICD-10-CM | POA: Diagnosis not present

## 2017-06-03 ENCOUNTER — Other Ambulatory Visit: Payer: Self-pay | Admitting: Family Medicine

## 2017-06-03 DIAGNOSIS — R740 Nonspecific elevation of levels of transaminase and lactic acid dehydrogenase [LDH]: Principal | ICD-10-CM

## 2017-06-03 DIAGNOSIS — R7401 Elevation of levels of liver transaminase levels: Secondary | ICD-10-CM

## 2017-06-09 ENCOUNTER — Ambulatory Visit
Admission: RE | Admit: 2017-06-09 | Discharge: 2017-06-09 | Disposition: A | Discharge status: Home / Self Care | Payer: BLUE CROSS/BLUE SHIELD | Source: Ambulatory Visit | Attending: Family Medicine | Admitting: Family Medicine

## 2017-06-09 DIAGNOSIS — K7689 Other specified diseases of liver: Secondary | ICD-10-CM | POA: Diagnosis not present

## 2017-06-09 DIAGNOSIS — R740 Nonspecific elevation of levels of transaminase and lactic acid dehydrogenase [LDH]: Principal | ICD-10-CM

## 2017-06-09 DIAGNOSIS — R7401 Elevation of levels of liver transaminase levels: Secondary | ICD-10-CM

## 2017-06-09 MED ORDER — GADOBENATE DIMEGLUMINE 529 MG/ML IV SOLN
17.0000 mL | Freq: Once | INTRAVENOUS | Status: AC | PRN
  Administered 2017-06-09: 17 mL via INTRAVENOUS

## 2017-06-17 ENCOUNTER — Ambulatory Visit: Payer: BLUE CROSS/BLUE SHIELD | Admitting: Internal Medicine

## 2017-06-18 ENCOUNTER — Ambulatory Visit (INDEPENDENT_AMBULATORY_CARE_PROVIDER_SITE_OTHER): Payer: BLUE CROSS/BLUE SHIELD | Admitting: Internal Medicine

## 2017-06-18 ENCOUNTER — Ambulatory Visit: Payer: BLUE CROSS/BLUE SHIELD | Admitting: Internal Medicine

## 2017-06-18 ENCOUNTER — Encounter: Payer: Self-pay | Admitting: Internal Medicine

## 2017-06-18 DIAGNOSIS — A7741 Ehrlichiosis chafeensis [E. chafeensis]: Secondary | ICD-10-CM

## 2017-06-18 NOTE — Progress Notes (Signed)
[facility]  Reason for Consult: Ehrlichiosis Referring Physician: Dr. Francis Wong  Assessment: Recent clinicaLeodis Siasl illness and serologies do suggest a diagnosis of ehrlichiosis that has responded to minocycline therapy.  Hyperbilirubinemia he has not very common with ehrlichiosis but everything else fits fairly well.  She has been treated appropriately and is scheduled to have follow-up blood work soon.  I do not feel that any further evaluation or treatment is needed.  Plan: 1. Follow-up lab work with Dr. Modesto CharonWong soon 2. Observe off of antibiotics 3. Follow-up here as needed  Patient Active Problem List   Diagnosis Date Noted  . 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 Recurrent cellulitis of lower leg 01/10/2016  . Generalized pruritus 01/10/2016  . 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 Lichen planus 01/10/2016  . MRSA bacteremia 01/10/2016  . Lichen sclerosus 03/29/2013  . Vaginal atrophy 03/29/2013      Medication List        Accurate as of 06/18/17  2:09 PM. Always use your most recent med list.          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  She was seen at a local urgent care where she tested negative for influenza.  However, she was told that she probably had the "flu".  She says that she was told to take 3 Tylenol 4 times a day.  Over the next few days she continued to feel very poorly and was extremely exhausted.  She had blood work done on 05/12/2017 which revealed a total bilirubin of 4, alkaline phosphatase of 485, AST 500 and an ALT of 499.  These were repeated on 05/15/2017 showing a total bilirubin of 1.2, alkaline phosphatase of 429, AST of 135 and ALT of 276.   She saw Dr. Modesto CharonWong on 05/16/2017 and was started on empiric levofloxacin.  Does not recall feeling any better.  She return for lab work on 05/22/2017.  Repeat blood work at that time showed that her total bilirubin was down to 0.8, alkaline phosphatase of 225, ALT of 67 and AST of 31.  She has no known tick exposures but her husband had been doing some reading and was concerned about tickborne illness.  Serologies for Lyme disease and ehrlichiosis were performed.  Ehrlichia IgM titer was elevated at 60.  Her Ehrlichia IgG titer was right at the cutoff of 1: 64.  Lyme serology was negative.  She was started on minocycline on 05/27/2017 and took it for 10 days.  She has noted gradual improvement over the past few weeks.  She tells me that an MRI of her abdomen.  She says that a chest x-ray revealed COPD which she does not believe that she has.  She is now back at work and exercising regularly.  She has not regained all of her stamina but is feeling much better.  Review of Systems: Review of Systems  Constitutional: Positive for malaise/fatigue. Negative for chills, diaphoresis, fever and weight loss.  HENT: Negative for sore throat.   Respiratory: Negative for cough, sputum production and shortness of breath.   Cardiovascular: Negative for chest pain.  Gastrointestinal: Negative for abdominal pain, diarrhea, heartburn, nausea and vomiting.  Genitourinary: Negative for dysuria and frequency.       She had very dark urine during her illness that has now returned to normal.  Musculoskeletal: Negative for joint pain and myalgias.  Skin: Negative for rash.       She has had no new skin lesions recently.  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 Phoebe SharpsJoe Jorizzo  Neurological: Negative for dizziness and headaches.      Past Medical History:  Diagnosis Date  . Generalized pruritus 01/10/2016  . Hiatal hernia   . History of MRSA infection   . Lichen planus 01/10/2016   . Missed ab    x2  . MRSA bacteremia 01/10/2016  . Recurrent cellulitis of lower leg 01/10/2016  . 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 Smoking status: Never Smoker  . Smokeless tobacco: Never Used  Substance Use Topics  . Alcohol use: Yes    Comment: wine once a week  . Drug use: No    Family History  Problem Relation Age of Onset  . Hypertension Mother   . Stroke Mother   . Heart disease Father    No Known Allergies  OBJECTIVE: Vitals:   06/18/17 1342  BP: (!) 144/84  Pulse: 81  Temp: (!) 97.3 F (36.3 C)  Weight: 185 lb (83.9 kg)   Body mass index is 28.98 kg/m.   Physical Exam  Constitutional: She is oriented to person, place, and time.  He is talkative and in good spirits.  HENT:  Mouth/Throat: No oropharyngeal exudate.  Eyes: Conjunctivae are normal.  Cardiovascular: Normal rate and regular rhythm.  No murmur heard. Pulmonary/Chest: Breath sounds normal.  Abdominal: Soft. She exhibits no mass. There is no tenderness.  Musculoskeletal: Normal range of motion.  Lymphadenopathy:       Head (right side): No submandibular adenopathy present.       Head (left side): No submandibular adenopathy present.    She has no cervical adenopathy.    She has no axillary adenopathy.  Neurological: She is alert and oriented to person, place, and time.  Skin: Rash noted.  She has one lesion on the dorsum of her left hand and several lesions on that show excoriations.  There are other areas of circular erythema.  These lesions are chronic.  Psychiatric: Mood and affect normal.    Microbiology: No results found for this or any previous visit (from the past 240 hour(s)).  Cliffton AstersJohn Teanna Elem, MD [facility] [phone] pager   [phone] cell 06/18/2017, 2:09 PM

## 2017-06-26 DIAGNOSIS — R7301 Impaired fasting glucose: Secondary | ICD-10-CM | POA: Diagnosis not present

## 2017-06-26 DIAGNOSIS — K760 Fatty (change of) liver, not elsewhere classified: Secondary | ICD-10-CM | POA: Diagnosis not present

## 2017-06-26 DIAGNOSIS — K210 Gastro-esophageal reflux disease with esophagitis: Secondary | ICD-10-CM | POA: Diagnosis not present

## 2017-06-26 DIAGNOSIS — R740 Nonspecific elevation of levels of transaminase and lactic acid dehydrogenase [LDH]: Secondary | ICD-10-CM | POA: Diagnosis not present

## 2017-07-31 DIAGNOSIS — R1110 Vomiting, unspecified: Secondary | ICD-10-CM | POA: Diagnosis not present

## 2017-07-31 DIAGNOSIS — Z86010 Personal history of colonic polyps: Secondary | ICD-10-CM | POA: Diagnosis not present

## 2017-07-31 DIAGNOSIS — R1310 Dysphagia, unspecified: Secondary | ICD-10-CM | POA: Diagnosis not present

## 2017-10-02 DIAGNOSIS — K2950 Unspecified chronic gastritis without bleeding: Secondary | ICD-10-CM | POA: Diagnosis not present

## 2017-10-02 DIAGNOSIS — R1310 Dysphagia, unspecified: Secondary | ICD-10-CM | POA: Diagnosis not present

## 2017-10-02 DIAGNOSIS — K222 Esophageal obstruction: Secondary | ICD-10-CM | POA: Diagnosis not present

## 2017-10-02 DIAGNOSIS — Z1211 Encounter for screening for malignant neoplasm of colon: Secondary | ICD-10-CM | POA: Diagnosis not present

## 2017-10-02 DIAGNOSIS — K449 Diaphragmatic hernia without obstruction or gangrene: Secondary | ICD-10-CM | POA: Diagnosis not present

## 2017-10-02 DIAGNOSIS — K210 Gastro-esophageal reflux disease with esophagitis: Secondary | ICD-10-CM | POA: Diagnosis not present

## 2017-10-21 ENCOUNTER — Other Ambulatory Visit: Payer: Self-pay | Admitting: Radiology

## 2017-10-21 DIAGNOSIS — N6320 Unspecified lump in the left breast, unspecified quadrant: Secondary | ICD-10-CM | POA: Diagnosis not present

## 2017-10-21 DIAGNOSIS — N644 Mastodynia: Secondary | ICD-10-CM | POA: Diagnosis not present

## 2017-10-24 ENCOUNTER — Ambulatory Visit
Admission: RE | Admit: 2017-10-24 | Discharge: 2017-10-24 | Disposition: A | Discharge status: Home / Self Care | Payer: BLUE CROSS/BLUE SHIELD | Source: Ambulatory Visit | Attending: Radiology | Admitting: Radiology

## 2017-10-24 DIAGNOSIS — N6320 Unspecified lump in the left breast, unspecified quadrant: Secondary | ICD-10-CM

## 2017-10-24 DIAGNOSIS — R922 Inconclusive mammogram: Secondary | ICD-10-CM | POA: Diagnosis not present

## 2017-11-04 DIAGNOSIS — Z8619 Personal history of other infectious and parasitic diseases: Secondary | ICD-10-CM | POA: Diagnosis not present

## 2017-11-04 DIAGNOSIS — L2089 Other atopic dermatitis: Secondary | ICD-10-CM | POA: Diagnosis not present

## 2017-11-04 DIAGNOSIS — Z5181 Encounter for therapeutic drug level monitoring: Secondary | ICD-10-CM | POA: Diagnosis not present

## 2017-11-04 DIAGNOSIS — Z79899 Other long term (current) drug therapy: Secondary | ICD-10-CM | POA: Diagnosis not present

## 2017-11-07 DIAGNOSIS — A7741 Ehrlichiosis chafeensis [E. chafeensis]: Secondary | ICD-10-CM | POA: Diagnosis not present

## 2017-11-07 DIAGNOSIS — R739 Hyperglycemia, unspecified: Secondary | ICD-10-CM | POA: Diagnosis not present

## 2017-11-07 DIAGNOSIS — K210 Gastro-esophageal reflux disease with esophagitis: Secondary | ICD-10-CM | POA: Diagnosis not present

## 2017-11-07 DIAGNOSIS — K449 Diaphragmatic hernia without obstruction or gangrene: Secondary | ICD-10-CM | POA: Diagnosis not present

## 2017-11-12 DIAGNOSIS — K210 Gastro-esophageal reflux disease with esophagitis: Secondary | ICD-10-CM | POA: Diagnosis not present

## 2017-11-12 DIAGNOSIS — R1013 Epigastric pain: Secondary | ICD-10-CM | POA: Diagnosis not present

## 2017-12-24 DIAGNOSIS — Z23 Encounter for immunization: Secondary | ICD-10-CM | POA: Diagnosis not present

## 2018-01-05 DIAGNOSIS — Z6828 Body mass index (BMI) 28.0-28.9, adult: Secondary | ICD-10-CM | POA: Diagnosis not present

## 2018-01-05 DIAGNOSIS — Z13820 Encounter for screening for osteoporosis: Secondary | ICD-10-CM | POA: Diagnosis not present

## 2018-01-05 DIAGNOSIS — Z01419 Encounter for gynecological examination (general) (routine) without abnormal findings: Secondary | ICD-10-CM | POA: Diagnosis not present

## 2018-01-12 DIAGNOSIS — H6122 Impacted cerumen, left ear: Secondary | ICD-10-CM | POA: Diagnosis not present

## 2018-01-12 DIAGNOSIS — M170 Bilateral primary osteoarthritis of knee: Secondary | ICD-10-CM | POA: Diagnosis not present

## 2018-01-12 DIAGNOSIS — H9012 Conductive hearing loss, unilateral, left ear, with unrestricted hearing on the contralateral side: Secondary | ICD-10-CM | POA: Diagnosis not present

## 2018-01-13 DIAGNOSIS — H5201 Hypermetropia, right eye: Secondary | ICD-10-CM | POA: Diagnosis not present

## 2018-01-13 DIAGNOSIS — H5202 Hypermetropia, left eye: Secondary | ICD-10-CM | POA: Diagnosis not present

## 2018-01-13 DIAGNOSIS — H524 Presbyopia: Secondary | ICD-10-CM | POA: Diagnosis not present

## 2018-01-13 DIAGNOSIS — H52221 Regular astigmatism, right eye: Secondary | ICD-10-CM | POA: Diagnosis not present

## 2018-03-02 DIAGNOSIS — L259 Unspecified contact dermatitis, unspecified cause: Secondary | ICD-10-CM | POA: Diagnosis not present

## 2018-03-05 DIAGNOSIS — S93601A Unspecified sprain of right foot, initial encounter: Secondary | ICD-10-CM | POA: Diagnosis not present

## 2018-03-05 DIAGNOSIS — M79671 Pain in right foot: Secondary | ICD-10-CM | POA: Diagnosis not present

## 2018-03-11 DIAGNOSIS — Z23 Encounter for immunization: Secondary | ICD-10-CM | POA: Diagnosis not present

## 2018-03-23 DIAGNOSIS — K5901 Slow transit constipation: Secondary | ICD-10-CM | POA: Diagnosis not present

## 2018-03-23 DIAGNOSIS — K219 Gastro-esophageal reflux disease without esophagitis: Secondary | ICD-10-CM | POA: Diagnosis not present

## 2018-03-24 DIAGNOSIS — L259 Unspecified contact dermatitis, unspecified cause: Secondary | ICD-10-CM | POA: Diagnosis not present

## 2018-05-06 DIAGNOSIS — L2089 Other atopic dermatitis: Secondary | ICD-10-CM | POA: Diagnosis not present

## 2018-05-06 DIAGNOSIS — L200 Besnier's prurigo: Secondary | ICD-10-CM | POA: Diagnosis not present

## 2018-05-06 DIAGNOSIS — Z5181 Encounter for therapeutic drug level monitoring: Secondary | ICD-10-CM | POA: Diagnosis not present

## 2018-06-16 DIAGNOSIS — M79622 Pain in left upper arm: Secondary | ICD-10-CM | POA: Diagnosis not present

## 2018-10-19 DIAGNOSIS — L237 Allergic contact dermatitis due to plants, except food: Secondary | ICD-10-CM | POA: Diagnosis not present

## 2018-11-23 DIAGNOSIS — S80819A Abrasion, unspecified lower leg, initial encounter: Secondary | ICD-10-CM | POA: Diagnosis not present

## 2018-11-23 DIAGNOSIS — L255 Unspecified contact dermatitis due to plants, except food: Secondary | ICD-10-CM | POA: Diagnosis not present

## 2018-12-10 DIAGNOSIS — K449 Diaphragmatic hernia without obstruction or gangrene: Secondary | ICD-10-CM | POA: Diagnosis not present

## 2018-12-10 DIAGNOSIS — R739 Hyperglycemia, unspecified: Secondary | ICD-10-CM | POA: Diagnosis not present

## 2018-12-10 DIAGNOSIS — Z79899 Other long term (current) drug therapy: Secondary | ICD-10-CM | POA: Diagnosis not present

## 2018-12-10 DIAGNOSIS — R202 Paresthesia of skin: Secondary | ICD-10-CM | POA: Diagnosis not present

## 2018-12-10 DIAGNOSIS — K210 Gastro-esophageal reflux disease with esophagitis: Secondary | ICD-10-CM | POA: Diagnosis not present

## 2018-12-10 DIAGNOSIS — A7741 Ehrlichiosis chafeensis [E. chafeensis]: Secondary | ICD-10-CM | POA: Diagnosis not present

## 2019-02-05 DIAGNOSIS — H903 Sensorineural hearing loss, bilateral: Secondary | ICD-10-CM | POA: Diagnosis not present

## 2019-02-05 DIAGNOSIS — R430 Anosmia: Secondary | ICD-10-CM | POA: Diagnosis not present

## 2019-05-04 DIAGNOSIS — L2084 Intrinsic (allergic) eczema: Secondary | ICD-10-CM | POA: Diagnosis not present

## 2019-05-04 DIAGNOSIS — L200 Besnier's prurigo: Secondary | ICD-10-CM | POA: Diagnosis not present

## 2019-08-06 DIAGNOSIS — R430 Anosmia: Secondary | ICD-10-CM | POA: Diagnosis not present

## 2019-08-06 DIAGNOSIS — Z20822 Contact with and (suspected) exposure to covid-19: Secondary | ICD-10-CM | POA: Diagnosis not present

## 2019-08-23 DIAGNOSIS — S7002XA Contusion of left hip, initial encounter: Secondary | ICD-10-CM | POA: Diagnosis not present

## 2019-09-10 DIAGNOSIS — H2513 Age-related nuclear cataract, bilateral: Secondary | ICD-10-CM | POA: Diagnosis not present

## 2019-09-28 DIAGNOSIS — L309 Dermatitis, unspecified: Secondary | ICD-10-CM | POA: Diagnosis not present

## 2019-10-05 DIAGNOSIS — L299 Pruritus, unspecified: Secondary | ICD-10-CM | POA: Diagnosis not present

## 2019-10-05 DIAGNOSIS — L2084 Intrinsic (allergic) eczema: Secondary | ICD-10-CM | POA: Diagnosis not present

## 2019-10-05 DIAGNOSIS — L280 Lichen simplex chronicus: Secondary | ICD-10-CM | POA: Diagnosis not present

## 2019-10-05 DIAGNOSIS — L281 Prurigo nodularis: Secondary | ICD-10-CM | POA: Diagnosis not present

## 2019-12-10 DIAGNOSIS — Z130 Encounter for screening for diseases of the blood and blood-forming organs and certain disorders involving the immune mechanism: Secondary | ICD-10-CM | POA: Diagnosis not present

## 2019-12-10 DIAGNOSIS — Z0000 Encounter for general adult medical examination without abnormal findings: Secondary | ICD-10-CM | POA: Diagnosis not present

## 2019-12-10 DIAGNOSIS — L281 Prurigo nodularis: Secondary | ICD-10-CM | POA: Diagnosis not present

## 2019-12-10 DIAGNOSIS — R739 Hyperglycemia, unspecified: Secondary | ICD-10-CM | POA: Diagnosis not present

## 2019-12-10 DIAGNOSIS — Z5181 Encounter for therapeutic drug level monitoring: Secondary | ICD-10-CM | POA: Diagnosis not present

## 2019-12-10 DIAGNOSIS — E559 Vitamin D deficiency, unspecified: Secondary | ICD-10-CM | POA: Diagnosis not present

## 2019-12-10 DIAGNOSIS — Z13220 Encounter for screening for lipoid disorders: Secondary | ICD-10-CM | POA: Diagnosis not present

## 2020-01-13 DIAGNOSIS — R1084 Generalized abdominal pain: Secondary | ICD-10-CM | POA: Diagnosis not present

## 2020-01-13 DIAGNOSIS — Z0000 Encounter for general adult medical examination without abnormal findings: Secondary | ICD-10-CM | POA: Diagnosis not present

## 2020-01-13 DIAGNOSIS — R109 Unspecified abdominal pain: Secondary | ICD-10-CM | POA: Diagnosis not present

## 2020-01-17 DIAGNOSIS — K2100 Gastro-esophageal reflux disease with esophagitis, without bleeding: Secondary | ICD-10-CM | POA: Diagnosis not present

## 2020-01-17 DIAGNOSIS — R1311 Dysphagia, oral phase: Secondary | ICD-10-CM | POA: Diagnosis not present

## 2020-01-27 DIAGNOSIS — K222 Esophageal obstruction: Secondary | ICD-10-CM | POA: Diagnosis not present

## 2020-01-27 DIAGNOSIS — K317 Polyp of stomach and duodenum: Secondary | ICD-10-CM | POA: Diagnosis not present

## 2020-01-27 DIAGNOSIS — K449 Diaphragmatic hernia without obstruction or gangrene: Secondary | ICD-10-CM | POA: Diagnosis not present

## 2020-01-27 DIAGNOSIS — K2100 Gastro-esophageal reflux disease with esophagitis, without bleeding: Secondary | ICD-10-CM | POA: Diagnosis not present

## 2020-01-27 DIAGNOSIS — R1310 Dysphagia, unspecified: Secondary | ICD-10-CM | POA: Diagnosis not present

## 2020-02-10 ENCOUNTER — Other Ambulatory Visit: Payer: Self-pay | Admitting: Gastroenterology

## 2020-02-10 DIAGNOSIS — K2100 Gastro-esophageal reflux disease with esophagitis, without bleeding: Secondary | ICD-10-CM | POA: Diagnosis not present

## 2020-02-10 DIAGNOSIS — R05 Cough: Secondary | ICD-10-CM | POA: Diagnosis not present

## 2020-02-10 DIAGNOSIS — R1311 Dysphagia, oral phase: Secondary | ICD-10-CM | POA: Diagnosis not present

## 2020-02-22 ENCOUNTER — Ambulatory Visit
Admission: RE | Admit: 2020-02-22 | Discharge: 2020-02-22 | Disposition: A | Discharge status: Home / Self Care | Payer: BLUE CROSS/BLUE SHIELD | Source: Ambulatory Visit | Attending: Gastroenterology | Admitting: Gastroenterology

## 2020-02-22 DIAGNOSIS — K449 Diaphragmatic hernia without obstruction or gangrene: Secondary | ICD-10-CM | POA: Diagnosis not present

## 2020-02-22 DIAGNOSIS — R1311 Dysphagia, oral phase: Secondary | ICD-10-CM

## 2020-02-22 DIAGNOSIS — K2100 Gastro-esophageal reflux disease with esophagitis, without bleeding: Secondary | ICD-10-CM

## 2020-03-23 DIAGNOSIS — W5501XA Bitten by cat, initial encounter: Secondary | ICD-10-CM | POA: Diagnosis not present

## 2020-03-23 DIAGNOSIS — S61552A Open bite of left wrist, initial encounter: Secondary | ICD-10-CM | POA: Diagnosis not present

## 2020-04-04 DIAGNOSIS — R55 Syncope and collapse: Secondary | ICD-10-CM | POA: Diagnosis not present

## 2020-04-04 DIAGNOSIS — R42 Dizziness and giddiness: Secondary | ICD-10-CM | POA: Diagnosis not present

## 2020-04-04 DIAGNOSIS — R0602 Shortness of breath: Secondary | ICD-10-CM | POA: Diagnosis not present

## 2020-04-05 ENCOUNTER — Other Ambulatory Visit: Payer: Self-pay | Admitting: Family Medicine

## 2020-04-05 DIAGNOSIS — R55 Syncope and collapse: Secondary | ICD-10-CM

## 2020-04-19 ENCOUNTER — Ambulatory Visit
Admission: RE | Admit: 2020-04-19 | Discharge: 2020-04-19 | Disposition: A | Discharge status: Home / Self Care | Payer: BC Managed Care – PPO | Source: Ambulatory Visit | Attending: Family Medicine | Admitting: Family Medicine

## 2020-04-19 ENCOUNTER — Other Ambulatory Visit: Payer: Self-pay

## 2020-04-19 DIAGNOSIS — R55 Syncope and collapse: Secondary | ICD-10-CM

## 2020-04-19 DIAGNOSIS — R42 Dizziness and giddiness: Secondary | ICD-10-CM | POA: Diagnosis not present

## 2020-05-11 DIAGNOSIS — Z1231 Encounter for screening mammogram for malignant neoplasm of breast: Secondary | ICD-10-CM | POA: Diagnosis not present

## 2020-05-11 DIAGNOSIS — Z6829 Body mass index (BMI) 29.0-29.9, adult: Secondary | ICD-10-CM | POA: Diagnosis not present

## 2020-05-11 DIAGNOSIS — Z01419 Encounter for gynecological examination (general) (routine) without abnormal findings: Secondary | ICD-10-CM | POA: Diagnosis not present

## 2020-05-11 DIAGNOSIS — Z13820 Encounter for screening for osteoporosis: Secondary | ICD-10-CM | POA: Diagnosis not present

## 2020-05-17 DIAGNOSIS — H903 Sensorineural hearing loss, bilateral: Secondary | ICD-10-CM | POA: Diagnosis not present

## 2021-01-19 ENCOUNTER — Other Ambulatory Visit: Payer: Self-pay | Admitting: Gastroenterology

## 2021-01-19 ENCOUNTER — Other Ambulatory Visit (HOSPITAL_COMMUNITY): Payer: Self-pay | Admitting: Gastroenterology

## 2021-01-19 DIAGNOSIS — K2100 Gastro-esophageal reflux disease with esophagitis, without bleeding: Secondary | ICD-10-CM | POA: Diagnosis not present

## 2021-01-19 DIAGNOSIS — R1110 Vomiting, unspecified: Secondary | ICD-10-CM | POA: Diagnosis not present

## 2021-01-19 DIAGNOSIS — R1111 Vomiting without nausea: Secondary | ICD-10-CM

## 2021-03-22 ENCOUNTER — Encounter (HOSPITAL_COMMUNITY): Payer: Self-pay

## 2021-03-22 ENCOUNTER — Other Ambulatory Visit (HOSPITAL_COMMUNITY): Payer: Self-pay

## 2021-05-11 DIAGNOSIS — R739 Hyperglycemia, unspecified: Secondary | ICD-10-CM | POA: Diagnosis not present

## 2021-05-11 DIAGNOSIS — K449 Diaphragmatic hernia without obstruction or gangrene: Secondary | ICD-10-CM | POA: Diagnosis not present

## 2021-05-11 DIAGNOSIS — Z23 Encounter for immunization: Secondary | ICD-10-CM | POA: Diagnosis not present

## 2021-05-11 DIAGNOSIS — H9193 Unspecified hearing loss, bilateral: Secondary | ICD-10-CM | POA: Diagnosis not present

## 2021-05-11 DIAGNOSIS — R430 Anosmia: Secondary | ICD-10-CM | POA: Diagnosis not present

## 2021-05-11 DIAGNOSIS — Z0000 Encounter for general adult medical examination without abnormal findings: Secondary | ICD-10-CM | POA: Diagnosis not present

## 2021-05-11 DIAGNOSIS — E559 Vitamin D deficiency, unspecified: Secondary | ICD-10-CM | POA: Diagnosis not present

## 2021-05-11 DIAGNOSIS — L281 Prurigo nodularis: Secondary | ICD-10-CM | POA: Diagnosis not present

## 2021-05-11 DIAGNOSIS — Z136 Encounter for screening for cardiovascular disorders: Secondary | ICD-10-CM | POA: Diagnosis not present

## 2021-05-11 DIAGNOSIS — Z13220 Encounter for screening for lipoid disorders: Secondary | ICD-10-CM | POA: Diagnosis not present

## 2021-05-11 DIAGNOSIS — Z5181 Encounter for therapeutic drug level monitoring: Secondary | ICD-10-CM | POA: Diagnosis not present

## 2021-05-11 DIAGNOSIS — Z130 Encounter for screening for diseases of the blood and blood-forming organs and certain disorders involving the immune mechanism: Secondary | ICD-10-CM | POA: Diagnosis not present

## 2021-06-11 DIAGNOSIS — H43812 Vitreous degeneration, left eye: Secondary | ICD-10-CM | POA: Diagnosis not present

## 2021-06-11 DIAGNOSIS — H2513 Age-related nuclear cataract, bilateral: Secondary | ICD-10-CM | POA: Diagnosis not present

## 2021-08-09 DIAGNOSIS — U071 COVID-19: Secondary | ICD-10-CM | POA: Diagnosis not present

## 2021-10-15 DIAGNOSIS — R0609 Other forms of dyspnea: Secondary | ICD-10-CM | POA: Diagnosis not present

## 2021-10-15 DIAGNOSIS — R430 Anosmia: Secondary | ICD-10-CM | POA: Diagnosis not present

## 2021-10-15 DIAGNOSIS — R030 Elevated blood-pressure reading, without diagnosis of hypertension: Secondary | ICD-10-CM | POA: Diagnosis not present

## 2021-10-15 DIAGNOSIS — R42 Dizziness and giddiness: Secondary | ICD-10-CM | POA: Diagnosis not present

## 2021-11-25 NOTE — Progress Notes (Deleted)
Cardiology Office Note:   Date:  11/25/2021  NAME:  Diane Price    MRN: [id number] DOB:  07-05-1956   PCP:  Ileana Ladd, MD  Cardiologist:  None  Electrophysiologist:  None   Referring MD: Ileana Ladd, MD   No chief complaint on file. ***  History of Present Illness:   Diane Price is a 66 y.o. female with a hx of hiatal hernia who is being seen today for the evaluation of SOB at the request of Ileana Ladd, MD.  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     Allergies:    Patient has no known allergies.   Social History: Social History   Socioeconomic History   Marital status: Married    Spouse name: Not on file   Number of children: Not on file   Years of education: Not on file   Highest education level: Not on file  Occupational History   Not on file  Tobacco Use   Smoking status: Never   Smokeless tobacco: Never  Substance and Sexual Activity   Alcohol use: Yes    Comment: wine once a week   Drug use: No   Sexual activity: Not on file  Other Topics Concern   Not on file  Social History Narrative   Not on file   Social Determinants of Health   Financial Resource Strain: Not on file  Food Insecurity: Not on file  Transportation Needs: Not on file  Physical Activity: Not on file  Stress: Not on file  Social Connections: Not on file     Family History: The patient's ***family history includes  Breast cancer in her maternal aunt and maternal aunt; Heart disease in her father; Hypertension in her mother; Stroke in her mother.  ROS:   All other ROS reviewed and negative. Pertinent positives noted in the HPI.     EKGs/Labs/Other Studies Reviewed:   The following studies were personally reviewed by me today:  EKG:  EKG is *** ordered today.  The ekg ordered today demonstrates ***, and was personally reviewed by me.   Recent Labs: No results found for requested labs within last 8760 hours.   Recent Lipid Panel No results found for: CHOL, TRIG, HDL, CHOLHDL, VLDL, LDLCALC, LDLDIRECT  Physical Exam:   VS:  There were no vitals taken for this visit.   Wt Readings from Last 3 Encounters:  06/18/17 185 lb (83.9 kg)  02/21/16 180 lb 8 oz (81.9 kg)  01/10/16 174 lb (78.9 kg)    General: Well nourished, well developed, in no acute distress Head: Atraumatic, normal size  Eyes: PEERLA, EOMI  Neck: Supple, no JVD Endocrine: No thryomegaly Cardiac: Normal S1, S2; RRR; no murmurs, rubs, or gallops Lungs: Clear to auscultation bilaterally, no wheezing, rhonchi or rales  Abd: Soft, nontender, no hepatomegaly  Ext: No edema, pulses 2+ Musculoskeletal: No deformities,  BUE and BLE strength normal and equal Skin: Warm and dry, no rashes   Neuro: Alert and oriented to person, place, time, and situation, CNII-XII grossly intact, no focal deficits  Psych: Normal mood and affect   ASSESSMENT:   Diane Price is a 66 y.o. female who presents for the following: No diagnosis found.  PLAN:   There are no diagnoses linked to this encounter.  {Are you ordering a CV Procedure (e.g. stress test, cath, DCCV, TEE, etc)?   Press F2        :[id number]}  Disposition: No follow-ups on file.  Medication Adjustments/Labs and Tests Ordered: Current medicines are reviewed at length with the patient today.  Concerns regarding medicines are outlined above.  No orders of the defined types were placed  in this encounter.  No orders of the defined types were placed in this encounter.   There are no Patient Instructions on file for this visit.   Time Spent with Patient: I have spent a total of *** minutes with patient reviewing hospital notes, telemetry, EKGs, labs and examining the patient as well as establishing an assessment and plan that was discussed with the patient.  > 50% of time was spent in direct patient care.  Signed, Lenna Gilford. Flora Lipps, MD, [facility]  [facility]  [address] [place], [place] [address] [phone]  11/25/2021 6:11 PM

## 2021-11-26 ENCOUNTER — Ambulatory Visit: Payer: Medicare HMO | Admitting: Cardiovascular Disease

## 2021-11-26 DIAGNOSIS — R0602 Shortness of breath: Secondary | ICD-10-CM

## 2021-11-27 ENCOUNTER — Encounter: Payer: Self-pay | Admitting: Cardiovascular Disease

## 2021-11-27 ENCOUNTER — Ambulatory Visit: Payer: Medicare HMO | Admitting: Cardiovascular Disease

## 2021-11-27 VITALS — BP 140/76 | HR 83 | Ht 67.0 in | Wt 184.4 lb

## 2021-11-27 DIAGNOSIS — R42 Dizziness and giddiness: Secondary | ICD-10-CM

## 2021-11-27 DIAGNOSIS — R0602 Shortness of breath: Secondary | ICD-10-CM | POA: Diagnosis not present

## 2021-11-27 DIAGNOSIS — R030 Elevated blood-pressure reading, without diagnosis of hypertension: Secondary | ICD-10-CM | POA: Diagnosis not present

## 2021-11-27 NOTE — Patient Instructions (Signed)
Medication Instructions:  The current medical regimen is effective;  continue present plan and medications.  *If you need a refill on your cardiac medications before your next appointment, please call your pharmacy*   Testing/Procedures: Echocardiogram - Your physician has requested that you have an echocardiogram. Echocardiography is a painless test that uses sound waves to create images of your heart. It provides your doctor with information about the size and shape of your heart and how well your heart's chambers and valves are working. This procedure takes approximately one hour. There are no restrictions for this procedure.   CALCIUM SCORE   Follow-Up: At [facility], you and your health needs are our priority.  As part of our continuing mission to provide you with exceptional heart care, we have created designated Provider Care Teams.  These Care Teams include your primary Cardiologist (physician) and Advanced Practice Providers (APPs -  Physician Assistants and Nurse Practitioners) who all work together to provide you with the care you need, when you need it.  We recommend signing up for the patient portal called "MyChart".  Sign up information is provided on this After Visit Summary.  MyChart is used to connect with patients for Virtual Visits (Telemedicine).  Patients are able to view lab/test results, encounter notes, upcoming appointments, etc.  Non-urgent messages can be sent to your provider as well.   To learn more about what you can do with MyChart, go to [email or web address].    Your next appointment:   As needed  The format for your next appointment:   In Person  Provider:   Lennie Odor, MD

## 2021-11-27 NOTE — Progress Notes (Signed)
Cardiology Office Note:   Date:  11/27/2021  NAME:  Diane Price    MRN: [id number] DOB:  Nov 25, 1955   PCP:  Ileana Ladd, MD  Cardiologist:  None  Electrophysiologist:  None   Referring MD: Ileana Ladd, MD   Chief Complaint  Patient presents with   Dizziness        History of Present Illness:   Diane Price is a 67 y.o. female with a hx of hiatal hernia who is being seen today for the evaluation of shortness of breath at the request of Ileana Ladd, MD. she reports for the last 6 months she has had an episode of lightheadedness upon bending over.  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  Apparently when she would bend over she would get lightheaded.  She reports this is coincided with elevated blood pressure values.  She tells me systolic values have ranged in the 160s.  She is not on medication.  She is wanting to avoid this.  She has never had a heart attack or stroke.  Most recent lab work shows total cholesterol 232, HDL 70, LDL 148, triglycerides 87.  A1c 5.4.  Hemoglobin 14.1.  Normal creatinine at 1.02.  Her EKG today in office demonstrates sinus rhythm heart rate 83.  No acute ischemic changes.  She reports she can get short of breath with heavy activity.  She suspects this is normal.  She describes no chest pain or pressure.  She has subsequently retired.  Her symptoms of lightheadedness have improved.  She does have a strong family history of heart disease.  Father had heart surgery in his 24s.  Mother had strokes.  She does not smoke.  No alcohol or drug use is reported.  She is married with 1 son.  She is started exercise.  She is working out 2 nights per week.  No chest pain or trouble breathing with this.  She reports that her diet is low-salt.  Blood pressure today 140/76.  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   clobetasol cream (TEMOVATE) 0.05 % Apply twice a week     Allergies:    Patient has no known allergies.   Social History: Social History   Socioeconomic History   Marital status: Married    Spouse name: Not on file   Number of children: 1   Years of education: Not on file   Highest education level: Not on file  Occupational History   Occupation: Retired  Tobacco Use   Smoking status: Never   Smokeless tobacco: Never  Substance and Sexual Activity   Alcohol use: Yes    Comment: wine once a week   Drug use: No   Sexual activity: Not on file  Other Topics Concern   Not on file  Social History Narrative   Not on file   Social  Determinants of Health   Financial Resource Strain: Not on file  Food Insecurity: Not on file  Transportation Needs: Not on file  Physical Activity: Not on file  Stress: Not on file  Social Connections: Not on file     Family History: The patient's family history includes Breast cancer in her maternal aunt and maternal aunt; Heart disease in her father; Hypertension in her mother; Stroke in her mother.  ROS:   All other ROS reviewed and negative. Pertinent positives noted in the HPI.     EKGs/Labs/Other Studies Reviewed:   The following studies were personally reviewed by me today:  EKG:  EKG is ordered today.  The ekg ordered today demonstrates normal sinus rhythm heart rate 8, and was personally reviewed by me.   Recent Labs: No results found for requested labs within last 8760 hours.   Recent Lipid Panel No results found for:  CHOL, TRIG, HDL, CHOLHDL, VLDL, LDLCALC, LDLDIRECT  Physical Exam:   VS:  BP 140/76   Pulse 83   Ht 5\' 7"  (1.702 m)   Wt 184 lb 6.4 oz (83.6 kg)   SpO2 98%   BMI 28.88 kg/m    Wt Readings from Last 3 Encounters:  11/27/21 184 lb 6.4 oz (83.6 kg)  06/18/17 185 lb (83.9 kg)  02/21/16 180 lb 8 oz (81.9 kg)    General: Well nourished, well developed, in no acute distress Head: Atraumatic, normal size  Eyes: PEERLA, EOMI  Neck: Supple, no JVD Endocrine: No thryomegaly Cardiac: Normal S1, S2; RRR; no murmurs, rubs, or gallops Lungs: Clear to auscultation bilaterally, no wheezing, rhonchi or rales  Abd: Soft, nontender, no hepatomegaly  Ext: No edema, pulses 2+ Musculoskeletal: No deformities, BUE and BLE strength normal and equal Skin: Warm and dry, no rashes   Neuro: Alert and oriented to person, place, time, and situation, CNII-XII grossly intact, no focal deficits  Psych: Normal mood and affect   ASSESSMENT:   Diane Price is a 66 y.o. female who presents for the following: 1. SOB (shortness of breath) on exertion   2. Dizziness   3. Elevated blood-pressure reading without diagnosis of hypertension     PLAN:   1. SOB (shortness of breath) on exertion -Likely expected.  No major symptoms.  Normal exam today.  EKG normal.  I would like for her to get an echocardiogram as well as calcium scoring due to family history of heart disease.  She should continue to exercise.  She will see us back as needed.  2. Dizziness -Likely related to elevated blood pressure.  She would like to continue with a conservative approach.  Blood pressure 140/76.  We will continue with diet and exercise for now.  We will obtain an echocardiogram.  Calcium scoring as well.  3. Elevated blood-pressure reading without diagnosis of hypertension -Continue to monitor.  No need for medication at this time.   Disposition: Return if symptoms worsen or fail to improve.  Medication Adjustments/Labs and  Tests Ordered: Current medicines are reviewed at length with the patient today.  Concerns regarding medicines are outlined above.  Orders Placed This Encounter  Procedures   CT CARDIAC SCORING (SELF PAY ONLY)   EKG 12-Lead   ECHOCARDIOGRAM COMPLETE   No orders of the defined types were placed in this encounter.   Patient Instructions  Medication Instructions:  The current medical regimen is effective;  continue present plan and medications.  *If you need a refill on your cardiac medications before  your next appointment, please call your pharmacy*   Testing/Procedures: Echocardiogram - Your physician has requested that you have an echocardiogram. Echocardiography is a painless test that uses sound waves to create images of your heart. It provides your doctor with information about the size and shape of your heart and how well your heart's chambers and valves are working. This procedure takes approximately one hour. There are no restrictions for this procedure.   CALCIUM SCORE   Follow-Up: At [facility], you and your health needs are our priority.  As part of our continuing mission to provide you with exceptional heart care, we have created designated Provider Care Teams.  These Care Teams include your primary Cardiologist (physician) and Advanced Practice Providers (APPs -  Physician Assistants and Nurse Practitioners) who all work together to provide you with the care you need, when you need it.  We recommend signing up for the patient portal called "MyChart".  Sign up information is provided on this After Visit Summary.  MyChart is used to connect with patients for Virtual Visits (Telemedicine).  Patients are able to view lab/test results, encounter notes, upcoming appointments, etc.  Non-urgent messages can be sent to your provider as well.   To learn more about what you can do with MyChart, go to [email or web address].    Your next appointment:   As needed  The format for  your next appointment:   In Person  Provider:   Lennie Odor, MD             Signed, Lenna Gilford. Flora Lipps, MD, [facility]  [facility]  [address] [place], [place] [address] [phone]  11/27/2021 3:16 PM

## 2021-12-07 DIAGNOSIS — J310 Chronic rhinitis: Secondary | ICD-10-CM | POA: Diagnosis not present

## 2021-12-07 DIAGNOSIS — R430 Anosmia: Secondary | ICD-10-CM | POA: Diagnosis not present

## 2021-12-07 DIAGNOSIS — J343 Hypertrophy of nasal turbinates: Secondary | ICD-10-CM | POA: Diagnosis not present

## 2021-12-07 DIAGNOSIS — H903 Sensorineural hearing loss, bilateral: Secondary | ICD-10-CM | POA: Diagnosis not present

## 2021-12-17 ENCOUNTER — Ambulatory Visit (INDEPENDENT_AMBULATORY_CARE_PROVIDER_SITE_OTHER)
Admission: RE | Admit: 2021-12-17 | Discharge: 2021-12-17 | Disposition: A | Discharge status: Home / Self Care | Payer: Self-pay | Source: Ambulatory Visit | Attending: Cardiovascular Disease | Admitting: Cardiovascular Disease

## 2021-12-17 ENCOUNTER — Ambulatory Visit (HOSPITAL_COMMUNITY): Payer: Medicare HMO | Attending: Cardiovascular Disease

## 2021-12-17 DIAGNOSIS — R42 Dizziness and giddiness: Secondary | ICD-10-CM | POA: Insufficient documentation

## 2021-12-17 DIAGNOSIS — R0602 Shortness of breath: Secondary | ICD-10-CM

## 2021-12-17 DIAGNOSIS — I358 Other nonrheumatic aortic valve disorders: Secondary | ICD-10-CM | POA: Insufficient documentation

## 2021-12-17 LAB — ECHOCARDIOGRAM COMPLETE
Area-P 1/2: 3.89 cm2
S' Lateral: 1.9 cm

## 2021-12-18 ENCOUNTER — Other Ambulatory Visit: Payer: Self-pay | Admitting: *Deleted

## 2021-12-18 MED ORDER — ATORVASTATIN CALCIUM 20 MG PO TABS: 20.0000 mg | ORAL_TABLET | Freq: Every day | ORAL | 3 refills | Status: DC

## 2021-12-26 ENCOUNTER — Other Ambulatory Visit: Payer: Self-pay

## 2021-12-26 ENCOUNTER — Emergency Department (HOSPITAL_COMMUNITY): Payer: No Typology Code available for payment source

## 2021-12-26 ENCOUNTER — Encounter (HOSPITAL_COMMUNITY): Payer: Self-pay

## 2021-12-26 ENCOUNTER — Inpatient Hospital Stay (HOSPITAL_COMMUNITY)
Admission: EM | Admit: 2021-12-26 | Discharge: 2021-12-30 | DRG: 493 | Disposition: A | Discharge status: Home Health | Payer: No Typology Code available for payment source | Attending: Student | Admitting: Student

## 2021-12-26 DIAGNOSIS — S20219A Contusion of unspecified front wall of thorax, initial encounter: Secondary | ICD-10-CM | POA: Diagnosis not present

## 2021-12-26 DIAGNOSIS — S82892A Other fracture of left lower leg, initial encounter for closed fracture: Secondary | ICD-10-CM | POA: Diagnosis not present

## 2021-12-26 DIAGNOSIS — L03119 Cellulitis of unspecified part of limb: Secondary | ICD-10-CM | POA: Diagnosis not present

## 2021-12-26 DIAGNOSIS — S42002A Fracture of unspecified part of left clavicle, initial encounter for closed fracture: Secondary | ICD-10-CM | POA: Diagnosis not present

## 2021-12-26 DIAGNOSIS — Z79899 Other long term (current) drug therapy: Secondary | ICD-10-CM | POA: Diagnosis not present

## 2021-12-26 DIAGNOSIS — S299XXA Unspecified injury of thorax, initial encounter: Secondary | ICD-10-CM | POA: Diagnosis not present

## 2021-12-26 DIAGNOSIS — J984 Other disorders of lung: Secondary | ICD-10-CM | POA: Diagnosis not present

## 2021-12-26 DIAGNOSIS — S93432A Sprain of tibiofibular ligament of left ankle, initial encounter: Secondary | ICD-10-CM | POA: Diagnosis not present

## 2021-12-26 DIAGNOSIS — M25572 Pain in left ankle and joints of left foot: Secondary | ICD-10-CM | POA: Diagnosis not present

## 2021-12-26 DIAGNOSIS — M25532 Pain in left wrist: Secondary | ICD-10-CM | POA: Diagnosis not present

## 2021-12-26 DIAGNOSIS — S82832A Other fracture of upper and lower end of left fibula, initial encounter for closed fracture: Secondary | ICD-10-CM | POA: Diagnosis not present

## 2021-12-26 DIAGNOSIS — S82842A Displaced bimalleolar fracture of left lower leg, initial encounter for closed fracture: Principal | ICD-10-CM | POA: Diagnosis present

## 2021-12-26 DIAGNOSIS — S0990XA Unspecified injury of head, initial encounter: Secondary | ICD-10-CM | POA: Diagnosis not present

## 2021-12-26 DIAGNOSIS — A7741 Ehrlichiosis chafeensis [E. chafeensis]: Secondary | ICD-10-CM | POA: Diagnosis not present

## 2021-12-26 DIAGNOSIS — Y92410 Unspecified street and highway as the place of occurrence of the external cause: Secondary | ICD-10-CM

## 2021-12-26 DIAGNOSIS — S3991XA Unspecified injury of abdomen, initial encounter: Secondary | ICD-10-CM | POA: Diagnosis not present

## 2021-12-26 DIAGNOSIS — G8918 Other acute postprocedural pain: Secondary | ICD-10-CM | POA: Diagnosis not present

## 2021-12-26 DIAGNOSIS — S82432A Displaced oblique fracture of shaft of left fibula, initial encounter for closed fracture: Secondary | ICD-10-CM | POA: Diagnosis not present

## 2021-12-26 DIAGNOSIS — J9811 Atelectasis: Secondary | ICD-10-CM | POA: Diagnosis not present

## 2021-12-26 DIAGNOSIS — S42022A Displaced fracture of shaft of left clavicle, initial encounter for closed fracture: Secondary | ICD-10-CM | POA: Diagnosis present

## 2021-12-26 DIAGNOSIS — S62002A Unspecified fracture of navicular [scaphoid] bone of left wrist, initial encounter for closed fracture: Secondary | ICD-10-CM | POA: Diagnosis present

## 2021-12-26 DIAGNOSIS — S52615A Nondisplaced fracture of left ulna styloid process, initial encounter for closed fracture: Secondary | ICD-10-CM | POA: Diagnosis not present

## 2021-12-26 DIAGNOSIS — S82842D Displaced bimalleolar fracture of left lower leg, subsequent encounter for closed fracture with routine healing: Secondary | ICD-10-CM | POA: Diagnosis not present

## 2021-12-26 DIAGNOSIS — S42002D Fracture of unspecified part of left clavicle, subsequent encounter for fracture with routine healing: Secondary | ICD-10-CM | POA: Diagnosis not present

## 2021-12-26 DIAGNOSIS — K429 Umbilical hernia without obstruction or gangrene: Secondary | ICD-10-CM | POA: Diagnosis not present

## 2021-12-26 DIAGNOSIS — S199XXA Unspecified injury of neck, initial encounter: Secondary | ICD-10-CM | POA: Diagnosis not present

## 2021-12-26 DIAGNOSIS — K449 Diaphragmatic hernia without obstruction or gangrene: Secondary | ICD-10-CM | POA: Diagnosis not present

## 2021-12-26 DIAGNOSIS — I951 Orthostatic hypotension: Secondary | ICD-10-CM | POA: Diagnosis not present

## 2021-12-26 DIAGNOSIS — R079 Chest pain, unspecified: Secondary | ICD-10-CM | POA: Diagnosis not present

## 2021-12-26 DIAGNOSIS — L299 Pruritus, unspecified: Secondary | ICD-10-CM | POA: Diagnosis not present

## 2021-12-26 DIAGNOSIS — M20099 Other deformity of finger(s), unspecified finger(s): Secondary | ICD-10-CM | POA: Diagnosis not present

## 2021-12-26 DIAGNOSIS — S62102A Fracture of unspecified carpal bone, left wrist, initial encounter for closed fracture: Secondary | ICD-10-CM

## 2021-12-26 DIAGNOSIS — Z041 Encounter for examination and observation following transport accident: Secondary | ICD-10-CM | POA: Diagnosis not present

## 2021-12-26 DIAGNOSIS — K5730 Diverticulosis of large intestine without perforation or abscess without bleeding: Secondary | ICD-10-CM | POA: Diagnosis not present

## 2021-12-26 DIAGNOSIS — M47812 Spondylosis without myelopathy or radiculopathy, cervical region: Secondary | ICD-10-CM | POA: Diagnosis not present

## 2021-12-26 HISTORY — DX: Unspecified osteoarthritis, unspecified site: M19.90

## 2021-12-26 HISTORY — DX: Hyperlipidemia, unspecified: E78.5

## 2021-12-26 LAB — CBC
HCT: 38.1 % (ref 36.0–46.0)
Hemoglobin: 12.9 g/dL (ref 12.0–15.0)
MCH: 32.8 pg (ref 26.0–34.0)
MCHC: 33.9 g/dL (ref 30.0–36.0)
MCV: 96.9 fL (ref 80.0–100.0)
Platelets: 247 10*3/uL (ref 150–400)
RBC: 3.93 MIL/uL (ref 3.87–5.11)
RDW: 12.3 % (ref 11.5–15.5)
WBC: 6.4 10*3/uL (ref 4.0–10.5)
nRBC: 0 % (ref 0.0–0.2)

## 2021-12-26 LAB — URINALYSIS, ROUTINE W REFLEX MICROSCOPIC
Bilirubin Urine: NEGATIVE
Glucose, UA: NEGATIVE mg/dL
Hgb urine dipstick: NEGATIVE
Ketones, ur: 5 mg/dL — AB
Leukocytes,Ua: NEGATIVE
Nitrite: NEGATIVE
Protein, ur: NEGATIVE mg/dL
Specific Gravity, Urine: 1.039 — ABNORMAL HIGH (ref 1.005–1.030)
pH: 7 (ref 5.0–8.0)

## 2021-12-26 LAB — SURGICAL PCR SCREEN
MRSA, PCR: NEGATIVE
Staphylococcus aureus: NEGATIVE

## 2021-12-26 LAB — COMPREHENSIVE METABOLIC PANEL
ALT: 36 U/L (ref 0–44)
AST: 35 U/L (ref 15–41)
Albumin: 3.8 g/dL (ref 3.5–5.0)
Alkaline Phosphatase: 97 U/L (ref 38–126)
Anion gap: 12 (ref 5–15)
BUN: 16 mg/dL (ref 8–23)
CO2: 24 mmol/L (ref 22–32)
Calcium: 9.1 mg/dL (ref 8.9–10.3)
Chloride: 102 mmol/L (ref 98–111)
Creatinine, Ser: 1.14 mg/dL — ABNORMAL HIGH (ref 0.44–1.00)
GFR, Estimated: 53 mL/min — ABNORMAL LOW (ref >60)
Glucose, Bld: 160 mg/dL — ABNORMAL HIGH (ref 70–99)
Potassium: 3.8 mmol/L (ref 3.5–5.1)
Sodium: 138 mmol/L (ref 135–145)
Total Bilirubin: 0.6 mg/dL (ref 0.3–1.2)
Total Protein: 6.7 g/dL (ref 6.5–8.1)

## 2021-12-26 LAB — I-STAT CHEM 8, ED
BUN: 18 mg/dL (ref 8–23)
Calcium, Ion: 1.11 mmol/L — ABNORMAL LOW (ref 1.15–1.40)
Chloride: 102 mmol/L (ref 98–111)
Creatinine, Ser: 1.1 mg/dL — ABNORMAL HIGH (ref 0.44–1.00)
Glucose, Bld: 149 mg/dL — ABNORMAL HIGH (ref 70–99)
HCT: 38 % (ref 36.0–46.0)
Hemoglobin: 12.9 g/dL (ref 12.0–15.0)
Potassium: 3.8 mmol/L (ref 3.5–5.1)
Sodium: 138 mmol/L (ref 135–145)
TCO2: 24 mmol/L (ref 22–32)

## 2021-12-26 LAB — PROTIME-INR
INR: 1 (ref 0.8–1.2)
Prothrombin Time: 13.1 seconds (ref 11.4–15.2)

## 2021-12-26 LAB — SAMPLE TO BLOOD BANK

## 2021-12-26 LAB — ETHANOL: Alcohol, Ethyl (B): <10 mg/dL (ref <10)

## 2021-12-26 LAB — LACTIC ACID, PLASMA: Lactic Acid, Venous: 1.8 mmol/L (ref 0.5–1.9)

## 2021-12-26 MED ORDER — MORPHINE SULFATE (PF) 2 MG/ML IV SOLN
2.0000 mg | INTRAVENOUS | Status: DC | PRN
  Administered 2021-12-26 – 2021-12-27 (×2): 2 mg via INTRAVENOUS
  Filled 2021-12-26 (×2): qty 1

## 2021-12-26 MED ORDER — CHLORHEXIDINE GLUCONATE 4 % EX LIQD: 60.0000 mL | Freq: Once | CUTANEOUS | Status: DC

## 2021-12-26 MED ORDER — ONDANSETRON HCL 4 MG PO TABS: 4.0000 mg | ORAL_TABLET | Freq: Four times a day (QID) | ORAL | Status: DC | PRN

## 2021-12-26 MED ORDER — METHOCARBAMOL 500 MG PO TABS
500.0000 mg | ORAL_TABLET | Freq: Four times a day (QID) | ORAL | Status: DC | PRN
  Administered 2021-12-26 – 2021-12-28 (×3): 500 mg via ORAL
  Filled 2021-12-26 (×3): qty 1

## 2021-12-26 MED ORDER — ONDANSETRON HCL 4 MG/2ML IJ SOLN: 4.0000 mg | Freq: Four times a day (QID) | INTRAMUSCULAR | Status: DC | PRN

## 2021-12-26 MED ORDER — IOHEXOL 350 MG/ML SOLN
100.0000 mL | Freq: Once | INTRAVENOUS | Status: AC | PRN
  Administered 2021-12-26: 100 mL via INTRAVENOUS

## 2021-12-26 MED ORDER — CEFAZOLIN SODIUM-DEXTROSE 2-4 GM/100ML-% IV SOLN
2.0000 g | INTRAVENOUS | Status: AC
  Administered 2021-12-27: 2 g via INTRAVENOUS
  Filled 2021-12-26: qty 100

## 2021-12-26 MED ORDER — OXYCODONE HCL 5 MG PO TABS
5.0000 mg | ORAL_TABLET | ORAL | Status: DC | PRN
  Administered 2021-12-26 – 2021-12-27 (×2): 15 mg via ORAL
  Administered 2021-12-28: 5 mg via ORAL
  Administered 2021-12-28: 15 mg via ORAL
  Administered 2021-12-28: 10 mg via ORAL
  Filled 2021-12-26 (×2): qty 3
  Filled 2021-12-26: qty 1
  Filled 2021-12-26: qty 3
  Filled 2021-12-26: qty 2

## 2021-12-26 MED ORDER — FENTANYL CITRATE PF 50 MCG/ML IJ SOSY
50.0000 ug | PREFILLED_SYRINGE | Freq: Once | INTRAMUSCULAR | Status: AC
  Administered 2021-12-26: 50 ug via INTRAVENOUS
  Filled 2021-12-26: qty 1

## 2021-12-26 MED ORDER — POVIDONE-IODINE 10 % EX SWAB
2.0000 | Freq: Once | CUTANEOUS | Status: AC
  Administered 2021-12-27: 2 via TOPICAL

## 2021-12-26 MED ORDER — ENOXAPARIN SODIUM 40 MG/0.4ML IJ SOSY
40.0000 mg | PREFILLED_SYRINGE | Freq: Every day | INTRAMUSCULAR | Status: DC
  Administered 2021-12-26: 40 mg via SUBCUTANEOUS
  Filled 2021-12-26: qty 0.4

## 2021-12-26 MED ORDER — METHOCARBAMOL 1000 MG/10ML IJ SOLN
500.0000 mg | Freq: Four times a day (QID) | INTRAVENOUS | Status: DC | PRN
  Filled 2021-12-26: qty 5
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
## 2021-12-26 NOTE — Progress Notes (Signed)
Orthopedic Tech Progress Note Patient Details:  Adrianna Dudas [facility] 29-Jul-1955 [id number]  Ortho Devices Type of Ortho Device: Thumb spica splint, Short leg splint, Stirrup splint Splint Material: Fiberglass Ortho Device/Splint Location: LUE & LLE Ortho Device/Splint Interventions: Application   Post Interventions Patient Tolerated: Well  Genelle Bal Kimblery Diop 12/26/2021, 1:24 PM

## 2021-12-26 NOTE — ED Notes (Signed)
Pt transported to CT ?
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
## 2021-12-26 NOTE — Plan of Care (Signed)
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
## 2021-12-26 NOTE — ED Notes (Signed)
Transport requested
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
## 2021-12-27 ENCOUNTER — Inpatient Hospital Stay (HOSPITAL_COMMUNITY): Payer: No Typology Code available for payment source

## 2021-12-27 ENCOUNTER — Encounter (HOSPITAL_COMMUNITY)
Admission: EM | Disposition: A | Discharge status: Home Health | Payer: Self-pay | Source: Home / Self Care | Attending: Student

## 2021-12-27 ENCOUNTER — Other Ambulatory Visit: Payer: Self-pay

## 2021-12-27 ENCOUNTER — Encounter (HOSPITAL_COMMUNITY): Payer: Self-pay | Admitting: Student

## 2021-12-27 ENCOUNTER — Inpatient Hospital Stay (HOSPITAL_COMMUNITY): Payer: No Typology Code available for payment source | Admitting: Certified Registered"

## 2021-12-27 DIAGNOSIS — S82842A Displaced bimalleolar fracture of left lower leg, initial encounter for closed fracture: Secondary | ICD-10-CM

## 2021-12-27 DIAGNOSIS — S42002A Fracture of unspecified part of left clavicle, initial encounter for closed fracture: Secondary | ICD-10-CM

## 2021-12-27 HISTORY — PX: ORIF CLAVICULAR FRACTURE: SHX5055

## 2021-12-27 HISTORY — PX: ORIF ANKLE FRACTURE: SHX5408

## 2021-12-27 LAB — HIV ANTIBODY (ROUTINE TESTING W REFLEX): HIV Screen 4th Generation wRfx: NONREACTIVE

## 2021-12-27 SURGERY — OPEN REDUCTION INTERNAL FIXATION (ORIF) CLAVICULAR FRACTURE
Anesthesia: General | Site: Chest | Laterality: Left

## 2021-12-27 MED ORDER — DIPHENHYDRAMINE HCL 12.5 MG/5ML PO ELIX: 12.5000 mg | ORAL_SOLUTION | ORAL | Status: DC | PRN

## 2021-12-27 MED ORDER — PHENYLEPHRINE HCL-NACL 20-0.9 MG/250ML-% IV SOLN
INTRAVENOUS | Status: DC | PRN
  Administered 2021-12-27: 30 ug/min via INTRAVENOUS

## 2021-12-27 MED ORDER — ATORVASTATIN CALCIUM 10 MG PO TABS
20.0000 mg | ORAL_TABLET | Freq: Every day | ORAL | Status: DC
  Administered 2021-12-27 – 2021-12-30 (×4): 20 mg via ORAL
  Filled 2021-12-27 (×4): qty 2

## 2021-12-27 MED ORDER — DEXAMETHASONE SODIUM PHOSPHATE 10 MG/ML IJ SOLN
INTRAMUSCULAR | Status: AC
  Filled 2021-12-27: qty 1

## 2021-12-27 MED ORDER — BUPIVACAINE-EPINEPHRINE 0.5% -1:200000 IJ SOLN
INTRAMUSCULAR | Status: AC
  Filled 2021-12-27: qty 1

## 2021-12-27 MED ORDER — SODIUM CHLORIDE 0.9 % IV SOLN: INTRAVENOUS | Status: DC

## 2021-12-27 MED ORDER — PROPOFOL 10 MG/ML IV BOLUS
INTRAVENOUS | Status: AC
  Filled 2021-12-27: qty 20

## 2021-12-27 MED ORDER — METOCLOPRAMIDE HCL 5 MG PO TABS: 5.0000 mg | ORAL_TABLET | Freq: Three times a day (TID) | ORAL | Status: DC | PRN

## 2021-12-27 MED ORDER — FENTANYL CITRATE (PF) 250 MCG/5ML IJ SOLN
INTRAMUSCULAR | Status: AC
  Filled 2021-12-27: qty 5

## 2021-12-27 MED ORDER — ROCURONIUM BROMIDE 10 MG/ML (PF) SYRINGE
PREFILLED_SYRINGE | INTRAVENOUS | Status: AC
  Filled 2021-12-27: qty 10

## 2021-12-27 MED ORDER — PANTOPRAZOLE SODIUM 40 MG PO TBEC
40.0000 mg | DELAYED_RELEASE_TABLET | Freq: Two times a day (BID) | ORAL | Status: DC
  Administered 2021-12-27 – 2021-12-30 (×7): 40 mg via ORAL
  Filled 2021-12-27 (×7): qty 1

## 2021-12-27 MED ORDER — ROPIVACAINE HCL 5 MG/ML IJ SOLN
INTRAMUSCULAR | Status: DC | PRN
  Administered 2021-12-27: 25 mL via PERINEURAL
  Administered 2021-12-27: 15 mL via PERINEURAL

## 2021-12-27 MED ORDER — CELECOXIB 200 MG PO CAPS
200.0000 mg | ORAL_CAPSULE | Freq: Two times a day (BID) | ORAL | Status: DC
  Administered 2021-12-27 – 2021-12-30 (×7): 200 mg via ORAL
  Filled 2021-12-27 (×8): qty 1

## 2021-12-27 MED ORDER — VANCOMYCIN HCL 1000 MG IV SOLR
INTRAVENOUS | Status: AC
  Filled 2021-12-27: qty 20

## 2021-12-27 MED ORDER — ONDANSETRON HCL 4 MG/2ML IJ SOLN
INTRAMUSCULAR | Status: AC
  Filled 2021-12-27: qty 2

## 2021-12-27 MED ORDER — ENOXAPARIN SODIUM 40 MG/0.4ML IJ SOSY
40.0000 mg | PREFILLED_SYRINGE | INTRAMUSCULAR | Status: DC
  Administered 2021-12-28 – 2021-12-30 (×3): 40 mg via SUBCUTANEOUS
  Filled 2021-12-27 (×3): qty 0.4

## 2021-12-27 MED ORDER — HYDROMORPHONE HCL 1 MG/ML IJ SOLN
INTRAMUSCULAR | Status: DC | PRN
  Administered 2021-12-27: .5 mg via INTRAVENOUS

## 2021-12-27 MED ORDER — POLYETHYLENE GLYCOL 3350 17 G PO PACK: 17.0000 g | PACK | Freq: Every day | ORAL | Status: DC | PRN

## 2021-12-27 MED ORDER — SUGAMMADEX SODIUM 500 MG/5ML IV SOLN
INTRAVENOUS | Status: AC
  Filled 2021-12-27: qty 5

## 2021-12-27 MED ORDER — BACITRACIN ZINC 500 UNIT/GM EX OINT
TOPICAL_OINTMENT | CUTANEOUS | Status: AC
Start: 2021-12-27 — End: ?
  Filled 2021-12-27: qty 28.35

## 2021-12-27 MED ORDER — LACTATED RINGERS IV SOLN: INTRAVENOUS | Status: DC

## 2021-12-27 MED ORDER — FENTANYL CITRATE (PF) 100 MCG/2ML IJ SOLN
INTRAMUSCULAR | Status: DC | PRN
  Administered 2021-12-27 (×3): 50 ug via INTRAVENOUS

## 2021-12-27 MED ORDER — HYDROMORPHONE HCL 1 MG/ML IJ SOLN
INTRAMUSCULAR | Status: AC
  Filled 2021-12-27: qty 0.5

## 2021-12-27 MED ORDER — ROCURONIUM BROMIDE 100 MG/10ML IV SOLN
INTRAVENOUS | Status: DC | PRN
  Administered 2021-12-27: 100 mg via INTRAVENOUS

## 2021-12-27 MED ORDER — CHLORHEXIDINE GLUCONATE 0.12 % MT SOLN
OROMUCOSAL | Status: AC
  Administered 2021-12-27: 15 mL via OROMUCOSAL
  Filled 2021-12-27: qty 15

## 2021-12-27 MED ORDER — METOCLOPRAMIDE HCL 5 MG/ML IJ SOLN: 5.0000 mg | Freq: Three times a day (TID) | INTRAMUSCULAR | Status: DC | PRN

## 2021-12-27 MED ORDER — CHLORHEXIDINE GLUCONATE 0.12 % MT SOLN: 15.0000 mL | Freq: Once | OROMUCOSAL | Status: AC

## 2021-12-27 MED ORDER — VANCOMYCIN HCL 1000 MG IV SOLR
INTRAVENOUS | Status: DC | PRN
  Administered 2021-12-27 (×2): 1000 mg

## 2021-12-27 MED ORDER — MIDAZOLAM HCL 2 MG/2ML IJ SOLN
INTRAMUSCULAR | Status: AC
  Administered 2021-12-27: 2 mg
  Filled 2021-12-27: qty 2

## 2021-12-27 MED ORDER — ORAL CARE MOUTH RINSE: 15.0000 mL | Freq: Once | OROMUCOSAL | Status: AC

## 2021-12-27 MED ORDER — ONDANSETRON HCL 4 MG/2ML IJ SOLN: 4.0000 mg | Freq: Four times a day (QID) | INTRAMUSCULAR | Status: DC | PRN

## 2021-12-27 MED ORDER — LIDOCAINE 2% (20 MG/ML) 5 ML SYRINGE
INTRAMUSCULAR | Status: AC
  Filled 2021-12-27: qty 5

## 2021-12-27 MED ORDER — ONDANSETRON HCL 4 MG PO TABS: 4.0000 mg | ORAL_TABLET | Freq: Four times a day (QID) | ORAL | Status: DC | PRN

## 2021-12-27 MED ORDER — PHENYLEPHRINE 80 MCG/ML (10ML) SYRINGE FOR IV PUSH (FOR BLOOD PRESSURE SUPPORT)
PREFILLED_SYRINGE | INTRAVENOUS | Status: DC | PRN
  Administered 2021-12-27 (×4): 80 ug via INTRAVENOUS

## 2021-12-27 MED ORDER — CEFAZOLIN SODIUM-DEXTROSE 2-4 GM/100ML-% IV SOLN
2.0000 g | Freq: Three times a day (TID) | INTRAVENOUS | Status: AC
  Administered 2021-12-27 – 2021-12-28 (×3): 2 g via INTRAVENOUS
  Filled 2021-12-27 (×3): qty 100

## 2021-12-27 MED ORDER — PHENYLEPHRINE 80 MCG/ML (10ML) SYRINGE FOR IV PUSH (FOR BLOOD PRESSURE SUPPORT)
PREFILLED_SYRINGE | INTRAVENOUS | Status: AC
  Filled 2021-12-27: qty 10

## 2021-12-27 MED ORDER — FENTANYL CITRATE (PF) 100 MCG/2ML IJ SOLN
INTRAMUSCULAR | Status: AC
  Administered 2021-12-27: 50 ug
  Filled 2021-12-27: qty 2

## 2021-12-27 MED ORDER — PHENYLEPHRINE HCL (PRESSORS) 10 MG/ML IV SOLN
INTRAVENOUS | Status: AC
Start: 2021-12-27 — End: ?
  Filled 2021-12-27: qty 1

## 2021-12-27 MED ORDER — DEXAMETHASONE SODIUM PHOSPHATE 10 MG/ML IJ SOLN
INTRAMUSCULAR | Status: DC | PRN
  Administered 2021-12-27: 5 mg via INTRAVENOUS

## 2021-12-27 MED ORDER — ONDANSETRON HCL 4 MG/2ML IJ SOLN
INTRAMUSCULAR | Status: DC | PRN
  Administered 2021-12-27: 4 mg via INTRAVENOUS

## 2021-12-27 MED ORDER — SUGAMMADEX SODIUM 200 MG/2ML IV SOLN
INTRAVENOUS | Status: DC | PRN
  Administered 2021-12-27: 200 mg via INTRAVENOUS

## 2021-12-27 MED ORDER — ACETAMINOPHEN 500 MG PO TABS
1000.0000 mg | ORAL_TABLET | Freq: Four times a day (QID) | ORAL | Status: AC
  Administered 2021-12-27 – 2021-12-28 (×4): 1000 mg via ORAL
  Filled 2021-12-27 (×4): qty 2

## 2021-12-27 MED ORDER — HYDRALAZINE HCL 10 MG PO TABS
10.0000 mg | ORAL_TABLET | Freq: Four times a day (QID) | ORAL | Status: DC | PRN
  Administered 2021-12-29 – 2021-12-30 (×2): 10 mg via ORAL
  Filled 2021-12-27 (×2): qty 1

## 2021-12-27 MED ORDER — PROPOFOL 10 MG/ML IV BOLUS
INTRAVENOUS | Status: DC | PRN
  Administered 2021-12-27: 30 mg via INTRAVENOUS
  Administered 2021-12-27: 150 mg via INTRAVENOUS

## 2021-12-27 MED ORDER — DOCUSATE SODIUM 100 MG PO CAPS
100.0000 mg | ORAL_CAPSULE | Freq: Two times a day (BID) | ORAL | Status: DC
Start: 2021-12-27 — End: 2021-12-30
  Administered 2021-12-27 – 2021-12-29 (×6): 100 mg via ORAL
  Filled 2021-12-27 (×6): qty 1

## 2021-12-27 MED ORDER — BUPIVACAINE HCL (PF) 0.25 % IJ SOLN
INTRAMUSCULAR | Status: DC | PRN
  Administered 2021-12-27: 15 mL

## 2021-12-27 MED ORDER — 0.9 % SODIUM CHLORIDE (POUR BTL) OPTIME
TOPICAL | Status: DC | PRN
  Administered 2021-12-27: 1000 mL

## 2021-12-27 SURGICAL SUPPLY — 100 items
BAG COUNTER SPONGE SURGICOUNT (BAG) ×3 IMPLANT
BANDAGE ESMARK 6X9 LF (GAUZE/BANDAGES/DRESSINGS) ×2 IMPLANT
BIT DRILL CLAV ALPS 2.7X145 (BIT) ×1 IMPLANT
BIT DRILL QC 2.0 SHORT EVOS SM (DRILL) IMPLANT
BIT DRILL QC 2.5MM SHRT EVO SM (DRILL) IMPLANT
BNDG COHESIVE 3X5 WHT NS (GAUZE/BANDAGES/DRESSINGS) ×1 IMPLANT
BNDG COHESIVE 4X5 TAN STRL (GAUZE/BANDAGES/DRESSINGS) ×3 IMPLANT
BNDG COHESIVE 6X5 TAN NS LF (GAUZE/BANDAGES/DRESSINGS) ×1 IMPLANT
BNDG ELASTIC 2 VLCR STRL LF (GAUZE/BANDAGES/DRESSINGS) ×1 IMPLANT
BNDG ELASTIC 4X5.8 VLCR STR LF (GAUZE/BANDAGES/DRESSINGS) IMPLANT
BNDG ELASTIC 6X5.8 VLCR STR LF (GAUZE/BANDAGES/DRESSINGS) IMPLANT
BNDG ESMARK 6X9 LF (GAUZE/BANDAGES/DRESSINGS) ×3
BRUSH SCRUB EZ PLAIN DRY (MISCELLANEOUS) ×6 IMPLANT
CHLORAPREP W/TINT 26 (MISCELLANEOUS) ×4 IMPLANT
COVER SURGICAL LIGHT HANDLE (MISCELLANEOUS) ×5 IMPLANT
DERMABOND ADVANCED (GAUZE/BANDAGES/DRESSINGS) ×1
DERMABOND ADVANCED .7 DNX12 (GAUZE/BANDAGES/DRESSINGS) ×4 IMPLANT
DRAPE C-ARM 42X72 X-RAY (DRAPES) ×3 IMPLANT
DRAPE C-ARMOR (DRAPES) ×3 IMPLANT
DRAPE EXTREMITY T 121X128X90 (DISPOSABLE) ×1 IMPLANT
DRAPE INCISE IOBAN 66X45 STRL (DRAPES) ×3 IMPLANT
DRAPE ORTHO SPLIT 77X108 STRL (DRAPES) ×6
DRAPE SURG ORHT 6 SPLT 77X108 (DRAPES) ×4 IMPLANT
DRAPE U-SHAPE 47X51 STRL (DRAPES) ×6 IMPLANT
DRILL QC 2.0 SHORT EVOS SM (DRILL) ×3
DRILL QC 2.5MM SHORT EVOS SM (DRILL) ×3
DRSG ADAPTIC 3X8 NADH LF (GAUZE/BANDAGES/DRESSINGS) IMPLANT
DRSG MEPILEX BORDER 4X8 (GAUZE/BANDAGES/DRESSINGS) ×3 IMPLANT
DRSG MEPITEL 4X7.2 (GAUZE/BANDAGES/DRESSINGS) ×1 IMPLANT
DRSG PAD ABDOMINAL 8X10 ST (GAUZE/BANDAGES/DRESSINGS) ×1 IMPLANT
ELECT REM PT RETURN 9FT ADLT (ELECTROSURGICAL) ×3
ELECTRODE REM PT RTRN 9FT ADLT (ELECTROSURGICAL) ×2 IMPLANT
GAUZE SPONGE 4X4 12PLY STRL (GAUZE/BANDAGES/DRESSINGS) ×2 IMPLANT
GLOVE BIO SURGEON STRL SZ 6.5 (GLOVE) ×9 IMPLANT
GLOVE BIO SURGEON STRL SZ7.5 (GLOVE) ×10 IMPLANT
GLOVE BIOGEL PI IND STRL 6.5 (GLOVE) ×2 IMPLANT
GLOVE BIOGEL PI IND STRL 7.5 (GLOVE) ×2 IMPLANT
GLOVE BIOGEL PI IND STRL 9 (GLOVE) IMPLANT
GLOVE BIOGEL PI INDICATOR 6.5 (GLOVE) ×1
GLOVE BIOGEL PI INDICATOR 7.5 (GLOVE) ×1
GLOVE BIOGEL PI INDICATOR 9 (GLOVE) ×1
GLOVE SURG SS PI 6.5 STRL IVOR (GLOVE) ×1 IMPLANT
GOWN STRL REUS W/ TWL LRG LVL3 (GOWN DISPOSABLE) ×4 IMPLANT
GOWN STRL REUS W/TWL LRG LVL3 (GOWN DISPOSABLE) ×12
K-WIRE 1.6 (WIRE) ×3
K-WIRE FX150X1.6XTROC PNT (WIRE) ×2
K-WIRE TROCHAR TIP ALPS 1.6 (WIRE) ×9
KIT BASIN OR (CUSTOM PROCEDURE TRAY) ×3 IMPLANT
KIT INVISIKNOT ANKLE FRACTURE (Screw) ×1 IMPLANT
KIT TURNOVER KIT B (KITS) ×3 IMPLANT
KWIRE FX150X1.6XTROC PNT (WIRE) IMPLANT
KWIRE TROCHAR TIP ALPS 1.6 (WIRE) IMPLANT
MANIFOLD NEPTUNE II (INSTRUMENTS) ×3 IMPLANT
NDL HYPO 21X1.5 SAFETY (NEEDLE) IMPLANT
NDL HYPO 25GX1X1/2 BEV (NEEDLE) ×2 IMPLANT
NEEDLE HYPO 21X1.5 SAFETY (NEEDLE) IMPLANT
NEEDLE HYPO 25GX1X1/2 BEV (NEEDLE) IMPLANT
NS IRRIG 1000ML POUR BTL (IV SOLUTION) ×3 IMPLANT
PACK GENERAL/GYN (CUSTOM PROCEDURE TRAY) ×3 IMPLANT
PACK TOTAL JOINT (CUSTOM PROCEDURE TRAY) ×3 IMPLANT
PAD ARMBOARD 7.5X6 YLW CONV (MISCELLANEOUS) ×6 IMPLANT
PAD CAST 3X4 CTTN HI CHSV (CAST SUPPLIES) IMPLANT
PAD CAST 4YDX4 CTTN HI CHSV (CAST SUPPLIES) IMPLANT
PADDING CAST COTTON 3X4 STRL (CAST SUPPLIES) ×6
PADDING CAST COTTON 4X4 STRL (CAST SUPPLIES) ×6
PADDING CAST COTTON 6X4 STRL (CAST SUPPLIES) ×3 IMPLANT
PLATE CLAV TIS DIP 90 8H LT (Plate) ×1 IMPLANT
PLATE L.DISTAL 2.7/3.5 7H EVOS (Plate) ×1 IMPLANT
SCREW CORT 2.7X15 T8 ST EVOS (Screw) ×2 IMPLANT
SCREW CORT 2.7X16 ST EVOS (Screw) ×1 IMPLANT
SCREW CORT 2.7X16 STAR T8 EVOS (Screw) ×1 IMPLANT
SCREW CORT 2.7X17 T8 ST EVOS (Screw) ×1 IMPLANT
SCREW CORT 3.5X10MM ST EVOS (Screw) ×3 IMPLANT
SCREW CORT LP 3.5X12 (Screw) ×4 IMPLANT
SCREW CORT LP 3.5X14 (Screw) ×1 IMPLANT
SCREW CORT LP T15 3.5X16 (Screw) ×1 IMPLANT
SCREW CORT VA EVOS 2.7X60 (Screw) ×2 IMPLANT
SCREW LP NL T15 3.5X22 (Screw) ×1 IMPLANT
SCREW T15 LP CORT 3.5X10MM (Screw) ×1 IMPLANT
SLING ARM FOAM STRAP LRG (SOFTGOODS) ×1 IMPLANT
SLING ARM IMMOBILIZER LRG (SOFTGOODS) IMPLANT
SLING ARM IMMOBILIZER MED (SOFTGOODS) IMPLANT
SPONGE T-LAP 18X18 ~~LOC~~+RFID (SPONGE) IMPLANT
STAPLER VISISTAT 35W (STAPLE) ×3 IMPLANT
STOCKINETTE IMPERVIOUS 9X36 MD (GAUZE/BANDAGES/DRESSINGS) IMPLANT
SUCTION FRAZIER HANDLE 10FR (MISCELLANEOUS) ×3
SUCTION TUBE FRAZIER 10FR DISP (MISCELLANEOUS) ×2 IMPLANT
SUT ETHILON 3 0 PS 1 (SUTURE) ×5 IMPLANT
SUT MNCRL AB 3-0 PS2 27 (SUTURE) ×3 IMPLANT
SUT MON AB 2-0 CT1 36 (SUTURE) ×1 IMPLANT
SUT PROLENE 0 CT (SUTURE) IMPLANT
SUT VIC AB 0 CT1 27 (SUTURE) ×3
SUT VIC AB 0 CT1 27XBRD ANBCTR (SUTURE) ×2 IMPLANT
SUT VIC AB 2-0 CT1 27 (SUTURE) ×6
SUT VIC AB 2-0 CT1 TAPERPNT 27 (SUTURE) ×4 IMPLANT
SYR CONTROL 10ML LL (SYRINGE) ×3 IMPLANT
TOWEL GREEN STERILE (TOWEL DISPOSABLE) ×6 IMPLANT
TOWEL GREEN STERILE FF (TOWEL DISPOSABLE) ×3 IMPLANT
UNDERPAD 30X36 HEAVY ABSORB (UNDERPADS AND DIAPERS) ×4 IMPLANT
WATER STERILE IRR 1000ML POUR (IV SOLUTION) ×3 IMPLANT
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
## 2021-12-27 NOTE — Anesthesia Procedure Notes (Signed)
Anesthesia Regional Block: Adductor canal block   Pre-Anesthetic Checklist: , timeout performed,  Correct Patient, Correct Site, Correct Laterality,  Correct Procedure, Correct Position, site marked,  Risks and benefits discussed,  Surgical consent,  Pre-op evaluation,  At surgeon's request and post-op pain management  Laterality: Left  Prep: chloraprep       Needles:  Injection technique: Single-shot  Needle Type: Echogenic Stimulator Needle     Needle Length: 10cm  Needle Gauge: 20     Additional Needles:   Procedures:,,,, ultrasound used (permanent image in chart),,    Narrative:  Start time: 12/27/2021 9:35 AM End time: 12/27/2021 9:38 AM Injection made incrementally with aspirations every 5 mL.  Performed by: Personally  Anesthesiologist: Lucretia Kern, MD  Additional Notes: Standard monitors applied. Skin prepped. Good needle visualization with ultrasound. Injection made in 5cc increments with no resistance to injection. Patient tolerated the procedure well.

## 2021-12-27 NOTE — Anesthesia Postprocedure Evaluation (Signed)
Anesthesia Post Note  Patient: Pearline Cables Dysert  Procedure(s) Performed: OPEN REDUCTION INTERNAL FIXATION (ORIF) CLAVICULAR FRACTURE (Left: Chest) OPEN REDUCTION INTERNAL FIXATION (ORIF) ANKLE FRACTURE (Left: Ankle)     Patient location during evaluation: PACU Anesthesia Type: General Level of consciousness: awake and alert Pain management: pain level controlled Vital Signs Assessment: post-procedure vital signs reviewed and stable Respiratory status: spontaneous breathing, nonlabored ventilation and respiratory function stable Cardiovascular status: blood pressure returned to baseline and stable Postop Assessment: no apparent nausea or vomiting Anesthetic complications: no   No notable events documented.  Last Vitals:  Vitals:   12/27/21 1330 12/27/21 1345  BP: (!) 143/73 (!) 145/78  Pulse: 91 91  Resp: 13 18  Temp:  36.6 C  SpO2: 97% 94%    Last Pain:  Vitals:   12/27/21 1345  TempSrc:   PainSc: 0-No pain                 Lucretia Kern

## 2021-12-27 NOTE — Transfer of Care (Signed)
Immediate Anesthesia Transfer of Care Note  Patient: Diane Price  Procedure(s) Performed: OPEN REDUCTION INTERNAL FIXATION (ORIF) CLAVICULAR FRACTURE (Left: Chest) OPEN REDUCTION INTERNAL FIXATION (ORIF) ANKLE FRACTURE (Left: Ankle)  Patient Location: PACU  Anesthesia Type:General and Regional  Level of Consciousness: drowsy and patient cooperative  Airway & Oxygen Therapy: Patient Spontanous Breathing and Patient connected to nasal cannula oxygen  Post-op Assessment: Report given to RN and Post -op Vital signs reviewed and stable  Post vital signs: Reviewed and stable  Last Vitals:  Vitals Value Taken Time  BP 138/66 12/27/21 1314  Temp    Pulse 96 12/27/21 1318  Resp 14 12/27/21 1318  SpO2 96 % 12/27/21 1318  Vitals shown include unvalidated device data.  Last Pain:  Vitals:   12/27/21 1005  TempSrc:   PainSc: 0-No pain      Patients Stated Pain Goal: 3 (12/27/21 1005)  Complications: No notable events documented.

## 2021-12-27 NOTE — Anesthesia Procedure Notes (Signed)
Anesthesia Regional Block: Interscalene brachial plexus block   Pre-Anesthetic Checklist: , timeout performed,  Correct Patient, Correct Site, Correct Laterality,  Correct Procedure, Correct Position, site marked,  Risks and benefits discussed,  Surgical consent,  Pre-op evaluation,  At surgeon's request and post-op pain management  Laterality: Left  Prep: chloraprep       Needles:  Injection technique: Single-shot  Needle Type: Echogenic Stimulator Needle     Needle Length: 10cm  Needle Gauge: 20     Additional Needles:   Procedures:,,,, ultrasound used (permanent image in chart),,    Narrative:  Start time: 12/27/2021 9:35 AM End time: 12/27/2021 9:38 AM Injection made incrementally with aspirations every 5 mL.  Performed by: Personally  Anesthesiologist: Lucretia Kern, MD  Additional Notes: Standard monitors applied. Skin prepped. Good needle visualization with ultrasound. Injection made in 5cc increments with no resistance to injection. Patient tolerated the procedure well.

## 2021-12-27 NOTE — Plan of Care (Signed)

## 2021-12-27 NOTE — Anesthesia Procedure Notes (Signed)
Procedure Name: Intubation Date/Time: 12/27/2021 11:05 AM  Performed by: Marny Lowenstein, CRNAPre-anesthesia Checklist: Patient identified, Emergency Drugs available, Suction available and Patient being monitored Patient Re-evaluated:Patient Re-evaluated prior to induction Oxygen Delivery Method: Circle system utilized Preoxygenation: Pre-oxygenation with 100% oxygen Induction Type: IV induction Ventilation: Mask ventilation without difficulty Laryngoscope Size: Miller and 2 Grade View: Grade I Tube type: Oral Tube size: 7.0 mm Number of attempts: 1 Airway Equipment and Method: Stylet Placement Confirmation: ETT inserted through vocal cords under direct vision, positive ETCO2 and breath sounds checked- equal and bilateral Secured at: 21 cm Tube secured with: Tape Dental Injury: Teeth and Oropharynx as per pre-operative assessment

## 2021-12-27 NOTE — H&P (Signed)
Please see orthopaedic consult note from 12/26/21 for full H&P

## 2021-12-27 NOTE — Anesthesia Procedure Notes (Signed)
Anesthesia Regional Block: Popliteal block   Pre-Anesthetic Checklist: , timeout performed,  Correct Patient, Correct Site, Correct Laterality,  Correct Procedure, Correct Position, site marked,  Risks and benefits discussed,  Surgical consent,  Pre-op evaluation,  At surgeon's request and post-op pain management  Laterality: Left  Prep: chloraprep       Needles:  Injection technique: Single-shot  Needle Type: Echogenic Stimulator Needle     Needle Length: 10cm  Needle Gauge: 20     Additional Needles:   Procedures:,,,, ultrasound used (permanent image in chart),,    Narrative:  Start time: 12/27/2021 9:38 AM End time: 12/27/2021 9:41 AM  Performed by: Personally  Anesthesiologist: Lucretia Kern, MD  Additional Notes: Standard monitors applied. Skin prepped. Good needle visualization with ultrasound. Injection made in 5cc increments with no resistance to injection. Patient tolerated the procedure well.
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
## 2021-12-28 ENCOUNTER — Other Ambulatory Visit (HOSPITAL_COMMUNITY): Payer: Self-pay

## 2021-12-28 ENCOUNTER — Encounter (HOSPITAL_COMMUNITY): Payer: Self-pay | Admitting: Student

## 2021-12-28 LAB — CBC
HCT: 34.3 % — ABNORMAL LOW (ref 36.0–46.0)
Hemoglobin: 11.3 g/dL — ABNORMAL LOW (ref 12.0–15.0)
MCH: 32 pg (ref 26.0–34.0)
MCHC: 32.9 g/dL (ref 30.0–36.0)
MCV: 97.2 fL (ref 80.0–100.0)
Platelets: 217 10*3/uL (ref 150–400)
RBC: 3.53 MIL/uL — ABNORMAL LOW (ref 3.87–5.11)
RDW: 12.1 % (ref 11.5–15.5)
WBC: 10.1 10*3/uL (ref 4.0–10.5)
nRBC: 0 % (ref 0.0–0.2)

## 2021-12-28 LAB — BASIC METABOLIC PANEL
Anion gap: 9 (ref 5–15)
BUN: 17 mg/dL (ref 8–23)
CO2: 24 mmol/L (ref 22–32)
Calcium: 8.7 mg/dL — ABNORMAL LOW (ref 8.9–10.3)
Chloride: 104 mmol/L (ref 98–111)
Creatinine, Ser: 1.25 mg/dL — ABNORMAL HIGH (ref 0.44–1.00)
GFR, Estimated: 48 mL/min — ABNORMAL LOW (ref >60)
Glucose, Bld: 185 mg/dL — ABNORMAL HIGH (ref 70–99)
Potassium: 4.6 mmol/L (ref 3.5–5.1)
Sodium: 137 mmol/L (ref 135–145)

## 2021-12-28 LAB — VITAMIN D 25 HYDROXY (VIT D DEFICIENCY, FRACTURES): Vit D, 25-Hydroxy: 51.8 ng/mL (ref 30–100)

## 2021-12-28 MED ORDER — METHOCARBAMOL 500 MG PO TABS
500.0000 mg | ORAL_TABLET | Freq: Four times a day (QID) | ORAL | 0 refills | Status: DC | PRN
  Filled 2021-12-28: qty 28, 7d supply, fill #0

## 2021-12-28 MED ORDER — GABAPENTIN 100 MG PO CAPS
100.0000 mg | ORAL_CAPSULE | Freq: Once | ORAL | Status: AC
Start: 2021-12-29 — End: 2021-12-29
  Administered 2021-12-29: 100 mg via ORAL
  Filled 2021-12-28: qty 1

## 2021-12-28 MED ORDER — ACETAMINOPHEN 500 MG PO TABS
1000.0000 mg | ORAL_TABLET | Freq: Four times a day (QID) | ORAL | 0 refills | Status: DC | PRN
Start: 2021-12-28 — End: 2022-11-07

## 2021-12-28 MED ORDER — OXYCODONE HCL 10 MG PO TABS
5.0000 mg | ORAL_TABLET | ORAL | 0 refills | Status: DC | PRN
  Filled 2021-12-28: qty 42, 7d supply, fill #0

## 2021-12-28 MED ORDER — APIXABAN 2.5 MG PO TABS
2.5000 mg | ORAL_TABLET | Freq: Two times a day (BID) | ORAL | 0 refills | Status: DC
  Filled 2021-12-28: qty 60, 30d supply, fill #0
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
## 2021-12-28 NOTE — Progress Notes (Signed)
Orthopaedic Trauma Progress Note  SUBJECTIVE: Doing okay this morning.  Pain controlled.  Has not been up out of bed yet since surgery.  Nerve block still partially in effect to the left lower extremity.  Notes that the splint to left wrist/hand is much more comfortable than her preoperative splint.  No chest pain. No SOB. No nausea/vomiting. No other complaints.  Hopeful to discharge home tomorrow, but wants to ensure that she has all the appropriate equipment prior to leaving.  Husband at bedside.  OBJECTIVE:  Vitals:   12/28/21 0313 12/28/21 0810  BP: (!) 150/85 136/68  Pulse: 92 88  Resp: 18 16  Temp: 98 F (36.7 C) 98 F (36.7 C)  SpO2: 97% 92%    General: Sitting up in bed, no acute distress Respiratory: No increased work of breathing.  LUE: Dressing over the clavicle clean, dry, intact.  Well fitting splint in place to left hand.  Tenderness about the shoulder as expected.  Less tender to the upper arm, elbow, and forearm.  Tolerates gentle elbow and shoulder motion without significant increase in pain.  Fingers warm and well-perfused.  LLE: Short leg splint in place.  Nontender above splint.  Endorses sensation to light touch over the plantar aspect of the foot.  Sensation slightly diminished to the toes due to nerve block still partially being ineffective.  Toes warm and well-perfused.  Able to wiggle toes.     IMAGING: Stable post op imaging.   LABS:  Results for orders placed or performed during the hospital encounter of 12/26/21 (from the past 24 hour(s))  CBC     Status: Abnormal   Collection Time: 12/28/21  3:33 AM  Result Value Ref Range   WBC 10.1 4.0 - 10.5 K/uL   RBC 3.53 (L) 3.87 - 5.11 MIL/uL   Hemoglobin 11.3 (L) 12.0 - 15.0 g/dL   HCT 16.1 (L) 09.6 - 04.5 %   MCV 97.2 80.0 - 100.0 fL   MCH 32.0 26.0 - 34.0 pg   MCHC 32.9 30.0 - 36.0 g/dL   RDW 40.9 81.1 - 91.4 %   Platelets 217 150 - 400 K/uL   nRBC 0.0 0.0 - 0.2 %  Basic metabolic panel     Status:  Abnormal   Collection Time: 12/28/21  3:33 AM  Result Value Ref Range   Sodium 137 135 - 145 mmol/L   Potassium 4.6 3.5 - 5.1 mmol/L   Chloride 104 98 - 111 mmol/L   CO2 24 22 - 32 mmol/L   Glucose, Bld 185 (H) 70 - 99 mg/dL   BUN 17 8 - 23 mg/dL   Creatinine, Ser 7.82 (H) 0.44 - 1.00 mg/dL   Calcium 8.7 (L) 8.9 - 10.3 mg/dL   GFR, Estimated 48 (L) >60 mL/min   Anion gap 9 5 - 15    ASSESSMENT: Diane Price is a 66 y.o. female, 1 Day Post-Op s/p ORIF LEFT CLAVICULAR FRACTURE ORIF LEFT ANKLE FRACTURE NONOPERATIVE MANAGEMENT LEFT SCAPHOID AND ULNAR STYLOID FRACTURE  CV/Blood loss: Hgb 11.3 this AM, slight drop from pre-op but overall remains stable.  Hemodynamically stable  PLAN: Weightbearing:   - LUE: NWB through wrist, okay to WB through elbow - LLE: NWB.  Okay to use knee scooter if able to tolerate ROM:  - LUE: Shoulder and elbow ROM as tolerated - LLE: Okay for knee and hip motion  Incisional and dressing care:  - LUE: Plan to remove clavicle dressing 12/29/2021.  Maintain wrist splint -  LLE: Maintain splint until follow-up Showering: Okay to begin showering 12/30/2021.  Keep LUE and LLE splints dry Orthopedic device(s): - LUE: Thumb spica splint.  Sling for comfort - LLE: Short leg splint Pain management:  1. Tylenol 1000 mg q 6 hours scheduled 2. Robaxin 500 mg q 6 hours PRN 3. Oxycodone 5-15 mg q 4 hours PRN 4. Morphine 2 mg q 4 hours PRN 5. Celebrex 200 mg BID VTE prophylaxis: Lovenox, SCDs ID:  Ancef 2gm post op Foley/Lines:  No foley, KVO IVFs Impediments to Fracture Healing: Vitamin D level pending, will start supplementation as indicated Dispo: PT/OT evaluations today, dispo pending.  Plan remove clavicle dressing tomorrow.    D/C recommendations: -Oxycodone and Robaxin for pain control -Eliquis 2.5 mg twice daily x30 days for DVT prophylaxis -Possible need for Vit D supplementation  Follow - up plan: 2 weeks after discharge for wound check and  repeat x-rays   Contact information:  Truitt Merle MD, Thyra Breed PA-C. After hours and holidays please check [email or web address] for group call information for [facility]   Thompson Caul, PA-C [phone] (office) [email or web address]

## 2021-12-29 LAB — BASIC METABOLIC PANEL
Anion gap: 8 (ref 5–15)
BUN: 18 mg/dL (ref 8–23)
CO2: 25 mmol/L (ref 22–32)
Calcium: 8.3 mg/dL — ABNORMAL LOW (ref 8.9–10.3)
Chloride: 105 mmol/L (ref 98–111)
Creatinine, Ser: 1.02 mg/dL — ABNORMAL HIGH (ref 0.44–1.00)
GFR, Estimated: >60 mL/min (ref >60)
Glucose, Bld: 171 mg/dL — ABNORMAL HIGH (ref 70–99)
Potassium: 3.9 mmol/L (ref 3.5–5.1)
Sodium: 138 mmol/L (ref 135–145)

## 2021-12-29 LAB — CBC
HCT: 27.9 % — ABNORMAL LOW (ref 36.0–46.0)
HCT: 31.3 % — ABNORMAL LOW (ref 36.0–46.0)
Hemoglobin: 9.6 g/dL — ABNORMAL LOW (ref 12.0–15.0)
Hemoglobin: 10.4 g/dL — ABNORMAL LOW (ref 12.0–15.0)
MCH: 33.7 pg (ref 26.0–34.0)
MCH: 32.2 pg (ref 26.0–34.0)
MCHC: 34.4 g/dL (ref 30.0–36.0)
MCHC: 33.2 g/dL (ref 30.0–36.0)
MCV: 97.9 fL (ref 80.0–100.0)
MCV: 96.9 fL (ref 80.0–100.0)
Platelets: 193 10*3/uL (ref 150–400)
Platelets: 220 10*3/uL (ref 150–400)
RBC: 2.85 MIL/uL — ABNORMAL LOW (ref 3.87–5.11)
RBC: 3.23 MIL/uL — ABNORMAL LOW (ref 3.87–5.11)
RDW: 12.3 % (ref 11.5–15.5)
RDW: 12.3 % (ref 11.5–15.5)
WBC: 8.4 10*3/uL (ref 4.0–10.5)
WBC: 7.4 10*3/uL (ref 4.0–10.5)
nRBC: 0 % (ref 0.0–0.2)
nRBC: 0 % (ref 0.0–0.2)

## 2021-12-29 MED ORDER — SODIUM CHLORIDE 0.9 % IV BOLUS
500.0000 mL | Freq: Once | INTRAVENOUS | Status: AC
Start: 2021-12-29 — End: 2021-12-29
  Administered 2021-12-29: 500 mL via INTRAVENOUS

## 2021-12-29 MED ORDER — SODIUM CHLORIDE 0.9 % IV BOLUS
500.0000 mL | Freq: Once | INTRAVENOUS | Status: AC
  Administered 2021-12-29: 500 mL via INTRAVENOUS

## 2021-12-29 MED ORDER — LABETALOL HCL 5 MG/ML IV SOLN
10.0000 mg | Freq: Once | INTRAVENOUS | Status: AC
  Administered 2021-12-29: 10 mg via INTRAVENOUS
  Filled 2021-12-29: qty 4

## 2021-12-29 NOTE — Progress Notes (Signed)
Orthopaedic Trauma Progress Note  SUBJECTIVE: Patient went to the bathroom on her own with her husband's assistance this morning. Was okay until she got to the toilet. She sat down and then became very dizzy. NT checked BP and she had a drop down to systolic BP in the 90s. She was then assisted to the chair where she became very pale and "blacked out." She was assisted back into the bed. She felt better at this point with some dizziness. She does not remember being in the chair. Her blood pressure returned to normal.   She had some burning in her ankle as the nerve block wore off. She was given gabapentin last night, which helped. However patient does admit to being sensitive to medications.   No chest pain. No SOB. No nausea/vomiting. No other complaints.  She is still hopeful to discharge home today, but understands with this new dizziness and drop in her BP we will have to monitor her closely. Husband at bedside.  OBJECTIVE:  Vitals:   12/29/21 0538 12/29/21 0830  BP:  (!) 144/66  Pulse: 88 74  Resp:  17  Temp:  98 F (36.7 C)  SpO2:  96%    General: Sitting up in bed, no acute distress Respiratory: No increased work of breathing.  LUE: Dressing over the clavicle clean, dry, intact.  Well fitting splint in place to left hand.  Tenderness about the shoulder as expected.  Less tender to the upper arm, elbow, and forearm.  Tolerates gentle elbow and shoulder motion without significant increase in pain.  Fingers warm and well-perfused.  LLE: Short leg splint in place.  Nontender above splint.  Endorses sensation to light touch over the plantar aspect of the foot.  Toes warm and well-perfused.  Able to wiggle toes.     IMAGING: Stable post op imaging.   LABS:  Results for orders placed or performed during the hospital encounter of 12/26/21 (from the past 24 hour(s))  CBC     Status: Abnormal   Collection Time: 12/29/21  2:32 AM  Result Value Ref Range   WBC 8.4 4.0 - 10.5 K/uL   RBC  2.85 (L) 3.87 - 5.11 MIL/uL   Hemoglobin 9.6 (L) 12.0 - 15.0 g/dL   HCT 16.1 (L) 09.6 - 04.5 %   MCV 97.9 80.0 - 100.0 fL   MCH 33.7 26.0 - 34.0 pg   MCHC 34.4 30.0 - 36.0 g/dL   RDW 40.9 81.1 - 91.4 %   Platelets 193 150 - 400 K/uL   nRBC 0.0 0.0 - 0.2 %    ASSESSMENT: Diane Price is a 66 y.o. female, 2 Days Post-Op s/p ORIF LEFT CLAVICULAR FRACTURE ORIF LEFT ANKLE FRACTURE NONOPERATIVE MANAGEMENT LEFT SCAPHOID AND ULNAR STYLOID FRACTURE  CV/Blood loss: Hgb 9.6 this AM, will continue to monitor  PLAN: Weightbearing:   - LUE: NWB through wrist, okay to WB through elbow - LLE: NWB.  Okay to use knee scooter if able to tolerate ROM:  - LUE: Shoulder and elbow ROM as tolerated - LLE: Okay for knee and hip motion  Incisional and dressing care:  - LUE: Reinforce as needed.  Maintain wrist splint - LLE: Maintain splint until follow-up Showering: Okay to begin showering 12/30/2021.  Keep LUE and LLE splints dry Orthopedic device(s): - LUE: Thumb spica splint.  Sling for comfort - LLE: Short leg splint Pain management:  1. Tylenol 1000 mg q 6 hours scheduled 2. Robaxin 500 mg q 6 hours PRN  3. Oxycodone 5-15 mg q 4 hours PRN 4. Morphine 2 mg q 4 hours PRN 5. Celebrex 200 mg BID VTE prophylaxis: Lovenox, SCDs ID:  Ancef 2gm post op Foley/Lines:  No foley, KVO IVFs Impediments to Fracture Healing: Vitamin D level pending, will start supplementation as indicated Dispo: PT/OT recommending home health. Patient having dizziness and orthostatic hypotension this morning. Bolus ordered. Will restart fluids. Will continue to monitor closely. Will need orthostatics checked this afternoon to ensure this improves prior to discharge and additional therapy today to ensure safety with discharge.   D/C recommendations: -Oxycodone and Robaxin for pain control -Eliquis 2.5 mg twice daily x30 days for DVT prophylaxis -Possible need for Vit D supplementation  Follow - up plan: 2 weeks after  discharge for wound check and repeat x-rays   Contact information: After hours and holidays please check [email or web address] for group call information for [facility]   Alfonse Alpers, PA-C 12/29/21

## 2021-12-29 NOTE — TOC CAGE-AID Note (Signed)
Transition of Care [facility]) - CAGE-AID Screening   Patient Details  Name: Brendaly Donnel MRN: [id number] Date of Birth: 09-09-1955  Transition of Care [facility]) CM/SW Contact:    Janora Norlander, RN Phone Number: [phone] 12/29/2021, 10:20 AM   Clinical Narrative: Pt here after sustaining an ankle fracture.  Pt states she does occasionally drink alcohol but not excessively and does not use recreational drugs.   CAGE-AID Screening:    Have You Ever Felt You Ought to Cut Down on Your Drinking or Drug Use?: No Have People Annoyed You By Critizing Your Drinking Or Drug Use?: No Have You Felt Bad Or Guilty About Your Drinking Or Drug Use?: No Have You Ever Had a Drink or Used Drugs First Thing In The Morning to Steady Your Nerves or to Get Rid of a Hangover?: No CAGE-AID Score: 0  Substance Abuse Education Offered: No

## 2021-12-29 NOTE — Progress Notes (Signed)
Pt verbalized that she was experiencing burning on left ankle and described it as" feels like fire", no discoloration noted, pt is able to wiggle her toes. Pt verbalized that it started after dinner today on 6/23.  Alfonse Alpers PA was contacted and placed verbal order for 1x dose gabapentin 100 mg, elevate left heel and ice it . Pt was educated on new orders. Pt verbalizes that she is still experiencing burning w/ prn oxy 5mg . Pt was encouraged to call within to 1hr to notify nurse gabapentin effectiveness.

## 2021-12-29 NOTE — Progress Notes (Signed)
Pt verbalized that Gabapentin 100 mg helped her with the burning sensation on left ankle. On coming nurse was notified.

## 2021-12-29 NOTE — Progress Notes (Signed)
Pt ambulated to bathroom to void with husband's assistance and while sitting on toilet pt became dizzy. RN called to room by [facility] NT. BP 90s/40s while sitting on toilet. NT assisted pt to chair. After pt ambulated back to bed with staff and pt lying down, pt "blacked out". Pt quickly returned to AxO x4, but she does not remember ambulating back to the bed from the bathroom. Pt states she still feels "a little dizzy" in bed, BP lying in bed 144/66. Alfonse Alpers, PA notified and to bedside. See new orders.

## 2021-12-30 LAB — CBC
HCT: 29.8 % — ABNORMAL LOW (ref 36.0–46.0)
Hemoglobin: 10 g/dL — ABNORMAL LOW (ref 12.0–15.0)
MCH: 32.6 pg (ref 26.0–34.0)
MCHC: 33.6 g/dL (ref 30.0–36.0)
MCV: 97.1 fL (ref 80.0–100.0)
Platelets: 211 10*3/uL (ref 150–400)
RBC: 3.07 MIL/uL — ABNORMAL LOW (ref 3.87–5.11)
RDW: 12.5 % (ref 11.5–15.5)
WBC: 6.3 10*3/uL (ref 4.0–10.5)
nRBC: 0 % (ref 0.0–0.2)

## 2021-12-30 LAB — BASIC METABOLIC PANEL
Anion gap: 9 (ref 5–15)
BUN: 13 mg/dL (ref 8–23)
CO2: 24 mmol/L (ref 22–32)
Calcium: 8.4 mg/dL — ABNORMAL LOW (ref 8.9–10.3)
Chloride: 108 mmol/L (ref 98–111)
Creatinine, Ser: 0.85 mg/dL (ref 0.44–1.00)
GFR, Estimated: >60 mL/min (ref >60)
Glucose, Bld: 119 mg/dL — ABNORMAL HIGH (ref 70–99)
Potassium: 4 mmol/L (ref 3.5–5.1)
Sodium: 141 mmol/L (ref 135–145)

## 2022-01-03 DIAGNOSIS — Z7901 Long term (current) use of anticoagulants: Secondary | ICD-10-CM | POA: Diagnosis not present

## 2022-01-03 DIAGNOSIS — S82842D Displaced bimalleolar fracture of left lower leg, subsequent encounter for closed fracture with routine healing: Secondary | ICD-10-CM | POA: Diagnosis not present

## 2022-01-03 DIAGNOSIS — Z9181 History of falling: Secondary | ICD-10-CM | POA: Diagnosis not present

## 2022-01-03 DIAGNOSIS — S82892A Other fracture of left lower leg, initial encounter for closed fracture: Secondary | ICD-10-CM | POA: Diagnosis not present

## 2022-01-03 DIAGNOSIS — S42002D Fracture of unspecified part of left clavicle, subsequent encounter for fracture with routine healing: Secondary | ICD-10-CM | POA: Diagnosis not present

## 2022-01-03 DIAGNOSIS — S52612D Displaced fracture of left ulna styloid process, subsequent encounter for closed fracture with routine healing: Secondary | ICD-10-CM | POA: Diagnosis not present

## 2022-01-10 DIAGNOSIS — S42002D Fracture of unspecified part of left clavicle, subsequent encounter for fracture with routine healing: Secondary | ICD-10-CM | POA: Diagnosis not present

## 2022-01-10 DIAGNOSIS — Z7901 Long term (current) use of anticoagulants: Secondary | ICD-10-CM | POA: Diagnosis not present

## 2022-01-10 DIAGNOSIS — S82842D Displaced bimalleolar fracture of left lower leg, subsequent encounter for closed fracture with routine healing: Secondary | ICD-10-CM | POA: Diagnosis not present

## 2022-01-10 DIAGNOSIS — Z9181 History of falling: Secondary | ICD-10-CM | POA: Diagnosis not present

## 2022-01-10 DIAGNOSIS — S52612D Displaced fracture of left ulna styloid process, subsequent encounter for closed fracture with routine healing: Secondary | ICD-10-CM | POA: Diagnosis not present

## 2022-01-11 DIAGNOSIS — Z7901 Long term (current) use of anticoagulants: Secondary | ICD-10-CM | POA: Diagnosis not present

## 2022-01-11 DIAGNOSIS — S42002D Fracture of unspecified part of left clavicle, subsequent encounter for fracture with routine healing: Secondary | ICD-10-CM | POA: Diagnosis not present

## 2022-01-11 DIAGNOSIS — Z9181 History of falling: Secondary | ICD-10-CM | POA: Diagnosis not present

## 2022-01-11 DIAGNOSIS — S82842D Displaced bimalleolar fracture of left lower leg, subsequent encounter for closed fracture with routine healing: Secondary | ICD-10-CM | POA: Diagnosis not present

## 2022-01-11 DIAGNOSIS — S52612D Displaced fracture of left ulna styloid process, subsequent encounter for closed fracture with routine healing: Secondary | ICD-10-CM | POA: Diagnosis not present

## 2022-01-15 DIAGNOSIS — S62002D Unspecified fracture of navicular [scaphoid] bone of left wrist, subsequent encounter for fracture with routine healing: Secondary | ICD-10-CM | POA: Diagnosis not present

## 2022-01-15 DIAGNOSIS — S82892D Other fracture of left lower leg, subsequent encounter for closed fracture with routine healing: Secondary | ICD-10-CM | POA: Diagnosis not present

## 2022-01-15 DIAGNOSIS — S62002A Unspecified fracture of navicular [scaphoid] bone of left wrist, initial encounter for closed fracture: Secondary | ICD-10-CM | POA: Diagnosis not present

## 2022-01-15 DIAGNOSIS — S93432D Sprain of tibiofibular ligament of left ankle, subsequent encounter: Secondary | ICD-10-CM | POA: Diagnosis not present

## 2022-01-15 DIAGNOSIS — S42022D Displaced fracture of shaft of left clavicle, subsequent encounter for fracture with routine healing: Secondary | ICD-10-CM | POA: Diagnosis not present

## 2022-01-16 DIAGNOSIS — Z9181 History of falling: Secondary | ICD-10-CM | POA: Diagnosis not present

## 2022-01-16 DIAGNOSIS — S82842D Displaced bimalleolar fracture of left lower leg, subsequent encounter for closed fracture with routine healing: Secondary | ICD-10-CM | POA: Diagnosis not present

## 2022-01-16 DIAGNOSIS — S42002D Fracture of unspecified part of left clavicle, subsequent encounter for fracture with routine healing: Secondary | ICD-10-CM | POA: Diagnosis not present

## 2022-01-16 DIAGNOSIS — S52612D Displaced fracture of left ulna styloid process, subsequent encounter for closed fracture with routine healing: Secondary | ICD-10-CM | POA: Diagnosis not present

## 2022-01-16 DIAGNOSIS — Z7901 Long term (current) use of anticoagulants: Secondary | ICD-10-CM | POA: Diagnosis not present

## 2022-01-17 DIAGNOSIS — S82842D Displaced bimalleolar fracture of left lower leg, subsequent encounter for closed fracture with routine healing: Secondary | ICD-10-CM | POA: Diagnosis not present

## 2022-01-17 DIAGNOSIS — S42002D Fracture of unspecified part of left clavicle, subsequent encounter for fracture with routine healing: Secondary | ICD-10-CM | POA: Diagnosis not present

## 2022-01-17 DIAGNOSIS — S52612D Displaced fracture of left ulna styloid process, subsequent encounter for closed fracture with routine healing: Secondary | ICD-10-CM | POA: Diagnosis not present

## 2022-01-17 DIAGNOSIS — Z9181 History of falling: Secondary | ICD-10-CM | POA: Diagnosis not present

## 2022-01-17 DIAGNOSIS — Z7901 Long term (current) use of anticoagulants: Secondary | ICD-10-CM | POA: Diagnosis not present

## 2022-01-18 DIAGNOSIS — S82842D Displaced bimalleolar fracture of left lower leg, subsequent encounter for closed fracture with routine healing: Secondary | ICD-10-CM | POA: Diagnosis not present

## 2022-01-18 DIAGNOSIS — S42002D Fracture of unspecified part of left clavicle, subsequent encounter for fracture with routine healing: Secondary | ICD-10-CM | POA: Diagnosis not present

## 2022-01-18 DIAGNOSIS — S52612D Displaced fracture of left ulna styloid process, subsequent encounter for closed fracture with routine healing: Secondary | ICD-10-CM | POA: Diagnosis not present

## 2022-01-18 DIAGNOSIS — Z9181 History of falling: Secondary | ICD-10-CM | POA: Diagnosis not present

## 2022-01-18 DIAGNOSIS — Z7901 Long term (current) use of anticoagulants: Secondary | ICD-10-CM | POA: Diagnosis not present

## 2022-01-21 DIAGNOSIS — Z9181 History of falling: Secondary | ICD-10-CM | POA: Diagnosis not present

## 2022-01-21 DIAGNOSIS — S52612D Displaced fracture of left ulna styloid process, subsequent encounter for closed fracture with routine healing: Secondary | ICD-10-CM | POA: Diagnosis not present

## 2022-01-21 DIAGNOSIS — S42002D Fracture of unspecified part of left clavicle, subsequent encounter for fracture with routine healing: Secondary | ICD-10-CM | POA: Diagnosis not present

## 2022-01-21 DIAGNOSIS — Z7901 Long term (current) use of anticoagulants: Secondary | ICD-10-CM | POA: Diagnosis not present

## 2022-01-21 DIAGNOSIS — S82842D Displaced bimalleolar fracture of left lower leg, subsequent encounter for closed fracture with routine healing: Secondary | ICD-10-CM | POA: Diagnosis not present

## 2022-01-29 DIAGNOSIS — S42022D Displaced fracture of shaft of left clavicle, subsequent encounter for fracture with routine healing: Secondary | ICD-10-CM | POA: Diagnosis not present

## 2022-01-29 DIAGNOSIS — S93432D Sprain of tibiofibular ligament of left ankle, subsequent encounter: Secondary | ICD-10-CM | POA: Diagnosis not present

## 2022-01-29 DIAGNOSIS — S62002A Unspecified fracture of navicular [scaphoid] bone of left wrist, initial encounter for closed fracture: Secondary | ICD-10-CM | POA: Diagnosis not present

## 2022-01-29 DIAGNOSIS — S62002D Unspecified fracture of navicular [scaphoid] bone of left wrist, subsequent encounter for fracture with routine healing: Secondary | ICD-10-CM | POA: Diagnosis not present

## 2022-01-29 DIAGNOSIS — S82892D Other fracture of left lower leg, subsequent encounter for closed fracture with routine healing: Secondary | ICD-10-CM | POA: Diagnosis not present

## 2022-01-30 DIAGNOSIS — S52612D Displaced fracture of left ulna styloid process, subsequent encounter for closed fracture with routine healing: Secondary | ICD-10-CM | POA: Diagnosis not present

## 2022-01-30 DIAGNOSIS — S42002D Fracture of unspecified part of left clavicle, subsequent encounter for fracture with routine healing: Secondary | ICD-10-CM | POA: Diagnosis not present

## 2022-01-30 DIAGNOSIS — Z7901 Long term (current) use of anticoagulants: Secondary | ICD-10-CM | POA: Diagnosis not present

## 2022-01-30 DIAGNOSIS — Z9181 History of falling: Secondary | ICD-10-CM | POA: Diagnosis not present

## 2022-01-30 DIAGNOSIS — S82842D Displaced bimalleolar fracture of left lower leg, subsequent encounter for closed fracture with routine healing: Secondary | ICD-10-CM | POA: Diagnosis not present

## 2022-01-31 DIAGNOSIS — Z9181 History of falling: Secondary | ICD-10-CM | POA: Diagnosis not present

## 2022-01-31 DIAGNOSIS — Z7901 Long term (current) use of anticoagulants: Secondary | ICD-10-CM | POA: Diagnosis not present

## 2022-01-31 DIAGNOSIS — S42002D Fracture of unspecified part of left clavicle, subsequent encounter for fracture with routine healing: Secondary | ICD-10-CM | POA: Diagnosis not present

## 2022-01-31 DIAGNOSIS — S82842D Displaced bimalleolar fracture of left lower leg, subsequent encounter for closed fracture with routine healing: Secondary | ICD-10-CM | POA: Diagnosis not present

## 2022-01-31 DIAGNOSIS — S52612D Displaced fracture of left ulna styloid process, subsequent encounter for closed fracture with routine healing: Secondary | ICD-10-CM | POA: Diagnosis not present

## 2022-02-04 DIAGNOSIS — S82842D Displaced bimalleolar fracture of left lower leg, subsequent encounter for closed fracture with routine healing: Secondary | ICD-10-CM | POA: Diagnosis not present

## 2022-02-04 DIAGNOSIS — Z7901 Long term (current) use of anticoagulants: Secondary | ICD-10-CM | POA: Diagnosis not present

## 2022-02-04 DIAGNOSIS — S52612D Displaced fracture of left ulna styloid process, subsequent encounter for closed fracture with routine healing: Secondary | ICD-10-CM | POA: Diagnosis not present

## 2022-02-04 DIAGNOSIS — S42002D Fracture of unspecified part of left clavicle, subsequent encounter for fracture with routine healing: Secondary | ICD-10-CM | POA: Diagnosis not present

## 2022-02-04 DIAGNOSIS — Z9181 History of falling: Secondary | ICD-10-CM | POA: Diagnosis not present

## 2022-02-07 DIAGNOSIS — R69 Illness, unspecified: Secondary | ICD-10-CM | POA: Diagnosis not present

## 2022-02-07 DIAGNOSIS — K219 Gastro-esophageal reflux disease without esophagitis: Secondary | ICD-10-CM | POA: Diagnosis not present

## 2022-02-07 DIAGNOSIS — Z09 Encounter for follow-up examination after completed treatment for conditions other than malignant neoplasm: Secondary | ICD-10-CM | POA: Diagnosis not present

## 2022-02-08 ENCOUNTER — Encounter (HOSPITAL_BASED_OUTPATIENT_CLINIC_OR_DEPARTMENT_OTHER): Payer: Self-pay | Admitting: Physical Therapy

## 2022-02-08 ENCOUNTER — Ambulatory Visit (HOSPITAL_BASED_OUTPATIENT_CLINIC_OR_DEPARTMENT_OTHER): Payer: No Typology Code available for payment source | Attending: Student | Admitting: Physical Therapy

## 2022-02-08 ENCOUNTER — Other Ambulatory Visit: Payer: Self-pay

## 2022-02-08 DIAGNOSIS — M6281 Muscle weakness (generalized): Secondary | ICD-10-CM | POA: Diagnosis not present

## 2022-02-08 DIAGNOSIS — M25612 Stiffness of left shoulder, not elsewhere classified: Secondary | ICD-10-CM | POA: Diagnosis not present

## 2022-02-08 DIAGNOSIS — R262 Difficulty in walking, not elsewhere classified: Secondary | ICD-10-CM | POA: Insufficient documentation

## 2022-02-08 DIAGNOSIS — M25672 Stiffness of left ankle, not elsewhere classified: Secondary | ICD-10-CM | POA: Diagnosis not present

## 2022-02-08 NOTE — Therapy (Addendum)
OUTPATIENT PHYSICAL THERAPY SHOULDER EVALUATION and DC  PHYSICAL THERAPY DISCHARGE SUMMARY  Visits from Start of Care: 1  Plan: Patient agrees to discharge.  Patient goals were not met. Patient is being discharged due to not returning to rehab.       Patient Name: Diane Price MRN: [id number] DOB:March 19, 1956, 66 y.o., female Today's Date: 02/08/2022   PT End of Session - 02/08/22 1241     Visit Number 1    Number of Visits 17    Date for PT Re-Evaluation 05/09/22    Authorization Type Aetn MCR    PT Start Time 1145    PT Stop Time 1230    PT Time Calculation (min) 45 min    Activity Tolerance Patient tolerated treatment well    Behavior During Therapy [facility] for tasks assessed/performed             Past Medical History:  Diagnosis Date   Arthritis    Generalized pruritus 01/10/2016   Hiatal hernia    History of MRSA infection    12 yrs ago as of 12/2021   HLD (hyperlipidemia)    Lichen planus 01/10/2016   Missed ab    x2   MRSA bacteremia 01/10/2016   hx 12 yrs ago as of 12/2021   Recurrent cellulitis of lower leg 01/10/2016   Ulnar deviation of finger 01/10/2016   Past Surgical History:  Procedure Laterality Date   CHOLECYSTECTOMY     colonoscoopy     x several   DILATION AND CURETTAGE OF UTERUS     ORIF ANKLE FRACTURE Left 12/27/2021   Procedure: OPEN REDUCTION INTERNAL FIXATION (ORIF) ANKLE FRACTURE;  Surgeon: Roby Lofts, MD;  Location: [facility] OR;  Service: Orthopedics;  Laterality: Left;   ORIF CLAVICULAR FRACTURE Left 12/27/2021   Procedure: OPEN REDUCTION INTERNAL FIXATION (ORIF) CLAVICULAR FRACTURE;  Surgeon: Roby Lofts, MD;  Location: [facility] OR;  Service: Orthopedics;  Laterality: Left;   WISDOM TOOTH EXTRACTION     Patient Active Problem List   Diagnosis Date Noted   Ankle fracture, left 12/26/2021   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   Recurrent cellulitis of lower leg 01/10/2016   Generalized pruritus 01/10/2016   Ulnar deviation of  finger 01/10/2016   Lichen planus 01/10/2016   MRSA bacteremia 01/10/2016   Lichen sclerosus 03/29/2013   Vaginal atrophy 03/29/2013    PCP: Ileana Ladd, MD  REFERRING PROVIDER: Roby Lofts, MD  REFERRING DIAG: L Ankle fx, L Clavicle, L Scuphoid   OMF L Clavicle, ankle  CPT 27814-Open reduction internal fixation left bimalleolar ankle fracture CPT 27829-Repair of left ankle syndesmosis CPT 23515-Open reduction internal fixation of left clavicle fracture  THERAPY DIAG:  Stiffness of left shoulder, not elsewhere classified - Plan: PT plan of care cert/re-cert  Muscle weakness (generalized) - Plan: PT plan of care cert/re-cert  Stiffness of left ankle, not elsewhere classified - Plan: PT plan of care cert/re-cert  Difficulty walking - Plan: PT plan of care cert/re-cert  Rationale for Evaluation and Treatment Rehabilitation  ONSET DATE: 12/27/21 Surgery  SUBJECTIVE:  SUBJECTIVE STATEMENT:  Pt is 6 wks post- op today.  Pt states she had HHPT prior to coming here today. Pt states she was able to get off of the walker and is now walking with the L CAM boot and L hand splint. She also has fractures to the L scaphoid and ulnar styloid that are being managed non-operatively. Pt to wear the boot and L hand splint up until next Wednesday, 8/9. Follow with Dr. Doreatha Martin the week of the 14th. Pt became WBAT on the L LE as of July 25th after seeing MD. Pt is unsure of WB precautions of the L hand. Pt is currently not driving and reports PTSD at this time.   Pt has been walking a slow mile 2x prior to this visit. Pt reports she has no precautions for the L shoulder. Pt has no open wounds at this time. Pt able to dress/undress on her own.   Pt states that waking up in the morning the L clavicle hurts and the L  instep hurts. Walking now hurts the R hip.   PERTINENT HISTORY: N/A   PAIN:  Are you having pain? Yes: NPRS scale: 4/10 Pain location: L clavicle, L instep Pain description: soreness, throbbing, numbness Aggravating factors: walking, moving after waking  Relieving factors: resting  PRECAUTIONS: Other:  She will be weightbearing as tolerated through the left elbow.  She will be nonweightbearing through the left wrist.   WEIGHT BEARING RESTRICTIONS Yes    FALLS:  Has patient fallen in last 6 months? No  LIVING ENVIRONMENT: Lives with: lives with their family and lives with their spouse Lives in: House/apartment town home  Stairs: No 1 step to enter and 1 step to exit Has following equipment at home:  platform walker, bathroom seat   OCCUPATION: Retired, would like to return to hiking  PLOF: Independent  PATIENT GOALS : return to hiking and working out.   OBJECTIVE:   DIAGNOSTIC FINDINGS:  IMPRESSION: Satisfactory ORIF left clavicle fracture.   IMPRESSION: Status post plate, screw, and syndesmotic tightrope ORIF of bimalleolar fractures of the left ankle. Anatomic alignment of fracture fragments.     PATIENT SURVEYS:  FOTO 54   shoulder; 70 @ DC 52 ankle, 71@ DC  COGNITION:  Overall cognitive status: Within functional limits for tasks assessed     SENSATION: Light touch: Impaired   POSTURE: Mild kyphosis, fwd shoulders  Mild R SB at trunk likely due to height of L boot in standing  UPPER EXTREMITY ROM:   Active ROM Right eval Left eval  Shoulder flexion [facility]  Shoulder extension    Shoulder abduction [facility] WFL p!  Shoulder adduction [facility]  Shoulder internal rotation [facility]  Shoulder external rotation [facility] WFL  Elbow flexion [facility] WFL  Elbow extension [facility] WFL  Wrist ulnar deviation  Unable to be tested  Wrist radial deviation  Unable to be tested  (Blank rows = not tested)  UPPER EXTREMITY MMT: 4+/5 throughout L in all planes, trunk  compensation with ER and ABD  AROM Left eval  Hip flexion [facility]  Hip extension [facility]  Hip abduction [facility]  Hip adduction   Hip internal rotation [facility]  Hip external rotation [facility]  Knee flexion WFL  Knee extension WFL  Ankle dorsiflexion -10  Ankle plantarflexion 40  Ankle inversion 2  Ankle eversion 10   (Blank rows = not tested)   Gait: decreased step length of R, antalgic, compensated R hip hike   TODAY'S TREATMENT:  Exercises - Gastroc Stretch on Wall  - 2 x daily - 7 x weekly - 1 sets - 3 reps - 30 hold - Supine Bridge  - 2 x daily - 7 x weekly - 2 sets - 10 reps - Standing Shoulder Row with Anchored Resistance  - 2 x daily - 7 x weekly - 2 sets - 10 reps  PATIENT EDUCATION: Education details: MOI, diagnosis, prognosis, anatomy, exercise progression, DOMS expectations, muscle firing,  envelope of function, HEP, POC  Person educated: Patient Education method: Explanation, Demonstration, Tactile cues, Verbal cues, and Handouts Education comprehension: verbalized understanding, returned demonstration, verbal cues required, and tactile cues required   HOME EXERCISE PROGRAM: Access Code: [email or web address] URL: [email or web address] Date: 02/08/2022 Prepared by: Daleen Bo  ASSESSMENT:  CLINICAL IMPRESSION: Patient is a 66 y.o. female who was seen today for physical therapy evaluation and treatment for s/p L clavicle ORIF, L ankle ORIF, and L scaphoid and ulnar styloid fracture. Per pt report, pt has been WBAT on the L LE and WBAT through the L UE. Pt is unsure of current WB precautions on L wrist but to follow up with MD on WB status. Pt is doing very well at the L shoulder with no visible ROM limitations though is limited in strength when compared to 5/5 on R side. Pt has expected L ankle ROM, strength, and gait deficits at this time following. Pt with well managed pain at this time aside from potential overuse related irritation with report of extended walking/foot  contacts. Plan to continue with L UE strength and L ankle mobility as tolerated. Pt would benefit from continued skilled therapy in order to reach goals and maximize functional L UE and L LE strength and ROM for full return to PLOF. Marland Kitchen    OBJECTIVE IMPAIRMENTS Abnormal gait, decreased activity tolerance, decreased balance, decreased endurance, decreased mobility, difficulty walking, decreased ROM, decreased strength, hypomobility, increased edema, increased fascial restrictions, increased muscle spasms, impaired sensation, impaired UE functional use, improper body mechanics, postural dysfunction, and pain.   ACTIVITY LIMITATIONS carrying, lifting, squatting, stairs, transfers, bed mobility, and locomotion level  PARTICIPATION LIMITATIONS: meal prep, cleaning, laundry, interpersonal relationship, driving, shopping, community activity, yard work, and exercise/recreation  PERSONAL FACTORS Age, Behavior pattern, and OA and multiple surgical sites  are also affecting patient's functional outcome.   REHAB POTENTIAL: Good  CLINICAL DECISION MAKING: Evolving/moderate complexity  EVALUATION COMPLEXITY: Moderate   GOALS:   SHORT TERM GOALS: Target date: 03/22/2022   Pt will become independent with HEP in order to demonstrate synthesis of PT education.   Goal status: INITIAL  2.  Pt will be able to demonstrate 5/5 shoulder strength in order to demonstrate functional improvement in UE/LE function for self-care and house hold duties.   Goal status: INITIAL  3.  Pt will be able to demonstrate reciprocal stair stepping pattern with single UE in order to demonstrate functional improvement in UE/LE function for self-care and house hold duties.   Goal status: INITIAL    LONG TERM GOALS: Target date: 05/03/2022   Pt  will become independent with final HEP in order to demonstrate synthesis of PT education.   Goal status: INITIAL  2.  Pt will score >/= 70 on FOTO to demonstrate improvement in  perceived L ankle function.   Goal status: INITIAL  3.  Pt will score >/= 71 on FOTO to demonstrate improvement in perceived L shoulder function.   Goal status: INITIAL  4.  Pt will be able to reach  OH and carry/hold >10 lbs in order to demonstrate functional improvement in L UE strength for return to PLOF and exercise.   Goal status: INITIAL 5.  Pt will be able to demonstrate/report ability to walk >30 mins without pain in order to demonstrate functional improvement and tolerance to exercise and community mobility.    Goal status: INITIAL     PLAN: PT FREQUENCY: 1-2x/week  PT DURATION: 12 weeks  PLANNED INTERVENTIONS: Therapeutic exercises, Therapeutic activity, Neuromuscular re-education, Balance training, Gait training, Patient/Family education, Self Care, Joint mobilization, Joint manipulation, Stair training, Orthotic/Fit training, DME instructions, Aquatic Therapy, Dry Needling, Electrical stimulation, Spinal manipulation, Spinal mobilization, Cryotherapy, Moist heat, scar mobilization, Splintting, Taping, Vasopneumatic device, Traction, Ultrasound, Ionotophoresis 4mg /ml Dexamethasone, Manual therapy, and Re-evaluation  PLAN FOR NEXT SESSION: intro to aquatic- establish aquatic HEP, Land: L ankle joint mobs, modified heel raise, sidestepping at table   , PT 02/08/2022, 1:22 PM

## 2022-02-12 DIAGNOSIS — M25532 Pain in left wrist: Secondary | ICD-10-CM | POA: Diagnosis not present

## 2022-02-12 DIAGNOSIS — Z4789 Encounter for other orthopedic aftercare: Secondary | ICD-10-CM | POA: Diagnosis not present

## 2022-02-12 DIAGNOSIS — M25572 Pain in left ankle and joints of left foot: Secondary | ICD-10-CM | POA: Diagnosis not present

## 2022-02-12 DIAGNOSIS — M25632 Stiffness of left wrist, not elsewhere classified: Secondary | ICD-10-CM | POA: Diagnosis not present

## 2022-02-12 DIAGNOSIS — M25512 Pain in left shoulder: Secondary | ICD-10-CM | POA: Diagnosis not present

## 2022-02-12 DIAGNOSIS — M25612 Stiffness of left shoulder, not elsewhere classified: Secondary | ICD-10-CM | POA: Diagnosis not present

## 2022-02-12 DIAGNOSIS — M25672 Stiffness of left ankle, not elsewhere classified: Secondary | ICD-10-CM | POA: Diagnosis not present

## 2022-02-15 DIAGNOSIS — M25632 Stiffness of left wrist, not elsewhere classified: Secondary | ICD-10-CM | POA: Diagnosis not present

## 2022-02-15 DIAGNOSIS — M25612 Stiffness of left shoulder, not elsewhere classified: Secondary | ICD-10-CM | POA: Diagnosis not present

## 2022-02-15 DIAGNOSIS — M25512 Pain in left shoulder: Secondary | ICD-10-CM | POA: Diagnosis not present

## 2022-02-15 DIAGNOSIS — M25572 Pain in left ankle and joints of left foot: Secondary | ICD-10-CM | POA: Diagnosis not present

## 2022-02-15 DIAGNOSIS — M25532 Pain in left wrist: Secondary | ICD-10-CM | POA: Diagnosis not present

## 2022-02-15 DIAGNOSIS — M25672 Stiffness of left ankle, not elsewhere classified: Secondary | ICD-10-CM | POA: Diagnosis not present

## 2022-02-15 DIAGNOSIS — Z4789 Encounter for other orthopedic aftercare: Secondary | ICD-10-CM | POA: Diagnosis not present

## 2022-02-19 DIAGNOSIS — M25512 Pain in left shoulder: Secondary | ICD-10-CM | POA: Diagnosis not present

## 2022-02-19 DIAGNOSIS — M25532 Pain in left wrist: Secondary | ICD-10-CM | POA: Diagnosis not present

## 2022-02-19 DIAGNOSIS — M25632 Stiffness of left wrist, not elsewhere classified: Secondary | ICD-10-CM | POA: Diagnosis not present

## 2022-02-19 DIAGNOSIS — M25672 Stiffness of left ankle, not elsewhere classified: Secondary | ICD-10-CM | POA: Diagnosis not present

## 2022-02-19 DIAGNOSIS — S62002D Unspecified fracture of navicular [scaphoid] bone of left wrist, subsequent encounter for fracture with routine healing: Secondary | ICD-10-CM | POA: Diagnosis not present

## 2022-02-19 DIAGNOSIS — M25612 Stiffness of left shoulder, not elsewhere classified: Secondary | ICD-10-CM | POA: Diagnosis not present

## 2022-02-19 DIAGNOSIS — S42022D Displaced fracture of shaft of left clavicle, subsequent encounter for fracture with routine healing: Secondary | ICD-10-CM | POA: Diagnosis not present

## 2022-02-19 DIAGNOSIS — S93432D Sprain of tibiofibular ligament of left ankle, subsequent encounter: Secondary | ICD-10-CM | POA: Diagnosis not present

## 2022-02-19 DIAGNOSIS — Z4789 Encounter for other orthopedic aftercare: Secondary | ICD-10-CM | POA: Diagnosis not present

## 2022-02-19 DIAGNOSIS — M25572 Pain in left ankle and joints of left foot: Secondary | ICD-10-CM | POA: Diagnosis not present

## 2022-02-19 DIAGNOSIS — S82892D Other fracture of left lower leg, subsequent encounter for closed fracture with routine healing: Secondary | ICD-10-CM | POA: Diagnosis not present

## 2022-02-21 DIAGNOSIS — Z4789 Encounter for other orthopedic aftercare: Secondary | ICD-10-CM | POA: Diagnosis not present

## 2022-02-21 DIAGNOSIS — M25632 Stiffness of left wrist, not elsewhere classified: Secondary | ICD-10-CM | POA: Diagnosis not present

## 2022-02-21 DIAGNOSIS — M25672 Stiffness of left ankle, not elsewhere classified: Secondary | ICD-10-CM | POA: Diagnosis not present

## 2022-02-21 DIAGNOSIS — M25612 Stiffness of left shoulder, not elsewhere classified: Secondary | ICD-10-CM | POA: Diagnosis not present

## 2022-02-21 DIAGNOSIS — M25532 Pain in left wrist: Secondary | ICD-10-CM | POA: Diagnosis not present

## 2022-02-21 DIAGNOSIS — M25512 Pain in left shoulder: Secondary | ICD-10-CM | POA: Diagnosis not present

## 2022-02-21 DIAGNOSIS — M25572 Pain in left ankle and joints of left foot: Secondary | ICD-10-CM | POA: Diagnosis not present

## 2022-02-25 DIAGNOSIS — Z4789 Encounter for other orthopedic aftercare: Secondary | ICD-10-CM | POA: Diagnosis not present

## 2022-02-25 DIAGNOSIS — M25672 Stiffness of left ankle, not elsewhere classified: Secondary | ICD-10-CM | POA: Diagnosis not present

## 2022-02-25 DIAGNOSIS — M25512 Pain in left shoulder: Secondary | ICD-10-CM | POA: Diagnosis not present

## 2022-02-25 DIAGNOSIS — M25632 Stiffness of left wrist, not elsewhere classified: Secondary | ICD-10-CM | POA: Diagnosis not present

## 2022-02-25 DIAGNOSIS — M25612 Stiffness of left shoulder, not elsewhere classified: Secondary | ICD-10-CM | POA: Diagnosis not present

## 2022-02-25 DIAGNOSIS — M25572 Pain in left ankle and joints of left foot: Secondary | ICD-10-CM | POA: Diagnosis not present

## 2022-02-25 DIAGNOSIS — M25532 Pain in left wrist: Secondary | ICD-10-CM | POA: Diagnosis not present

## 2022-02-27 DIAGNOSIS — M25572 Pain in left ankle and joints of left foot: Secondary | ICD-10-CM | POA: Diagnosis not present

## 2022-02-27 DIAGNOSIS — M25672 Stiffness of left ankle, not elsewhere classified: Secondary | ICD-10-CM | POA: Diagnosis not present

## 2022-02-27 DIAGNOSIS — Z4789 Encounter for other orthopedic aftercare: Secondary | ICD-10-CM | POA: Diagnosis not present

## 2022-02-27 DIAGNOSIS — M25612 Stiffness of left shoulder, not elsewhere classified: Secondary | ICD-10-CM | POA: Diagnosis not present

## 2022-02-27 DIAGNOSIS — M25632 Stiffness of left wrist, not elsewhere classified: Secondary | ICD-10-CM | POA: Diagnosis not present

## 2022-02-27 DIAGNOSIS — M25532 Pain in left wrist: Secondary | ICD-10-CM | POA: Diagnosis not present

## 2022-02-27 DIAGNOSIS — M25512 Pain in left shoulder: Secondary | ICD-10-CM | POA: Diagnosis not present

## 2022-03-04 DIAGNOSIS — M25672 Stiffness of left ankle, not elsewhere classified: Secondary | ICD-10-CM | POA: Diagnosis not present

## 2022-03-04 DIAGNOSIS — M25632 Stiffness of left wrist, not elsewhere classified: Secondary | ICD-10-CM | POA: Diagnosis not present

## 2022-03-04 DIAGNOSIS — M25532 Pain in left wrist: Secondary | ICD-10-CM | POA: Diagnosis not present

## 2022-03-04 DIAGNOSIS — M25612 Stiffness of left shoulder, not elsewhere classified: Secondary | ICD-10-CM | POA: Diagnosis not present

## 2022-03-04 DIAGNOSIS — M25512 Pain in left shoulder: Secondary | ICD-10-CM | POA: Diagnosis not present

## 2022-03-04 DIAGNOSIS — Z4789 Encounter for other orthopedic aftercare: Secondary | ICD-10-CM | POA: Diagnosis not present

## 2022-03-04 DIAGNOSIS — M25572 Pain in left ankle and joints of left foot: Secondary | ICD-10-CM | POA: Diagnosis not present

## 2022-03-06 DIAGNOSIS — Z4789 Encounter for other orthopedic aftercare: Secondary | ICD-10-CM | POA: Diagnosis not present

## 2022-03-06 DIAGNOSIS — M25612 Stiffness of left shoulder, not elsewhere classified: Secondary | ICD-10-CM | POA: Diagnosis not present

## 2022-03-06 DIAGNOSIS — M25512 Pain in left shoulder: Secondary | ICD-10-CM | POA: Diagnosis not present

## 2022-03-06 DIAGNOSIS — M25632 Stiffness of left wrist, not elsewhere classified: Secondary | ICD-10-CM | POA: Diagnosis not present

## 2022-03-06 DIAGNOSIS — M25572 Pain in left ankle and joints of left foot: Secondary | ICD-10-CM | POA: Diagnosis not present

## 2022-03-06 DIAGNOSIS — M25672 Stiffness of left ankle, not elsewhere classified: Secondary | ICD-10-CM | POA: Diagnosis not present

## 2022-03-06 DIAGNOSIS — M25532 Pain in left wrist: Secondary | ICD-10-CM | POA: Diagnosis not present

## 2022-03-07 ENCOUNTER — Ambulatory Visit (HOSPITAL_BASED_OUTPATIENT_CLINIC_OR_DEPARTMENT_OTHER): Payer: Self-pay | Admitting: Physical Therapy

## 2022-03-08 ENCOUNTER — Ambulatory Visit (HOSPITAL_BASED_OUTPATIENT_CLINIC_OR_DEPARTMENT_OTHER): Payer: Self-pay | Admitting: Physical Therapy

## 2022-03-12 ENCOUNTER — Ambulatory Visit (HOSPITAL_BASED_OUTPATIENT_CLINIC_OR_DEPARTMENT_OTHER): Payer: Self-pay | Admitting: Physical Therapy

## 2022-03-12 DIAGNOSIS — M25632 Stiffness of left wrist, not elsewhere classified: Secondary | ICD-10-CM | POA: Diagnosis not present

## 2022-03-12 DIAGNOSIS — M25512 Pain in left shoulder: Secondary | ICD-10-CM | POA: Diagnosis not present

## 2022-03-12 DIAGNOSIS — M25572 Pain in left ankle and joints of left foot: Secondary | ICD-10-CM | POA: Diagnosis not present

## 2022-03-12 DIAGNOSIS — M25672 Stiffness of left ankle, not elsewhere classified: Secondary | ICD-10-CM | POA: Diagnosis not present

## 2022-03-12 DIAGNOSIS — Z4789 Encounter for other orthopedic aftercare: Secondary | ICD-10-CM | POA: Diagnosis not present

## 2022-03-12 DIAGNOSIS — M25612 Stiffness of left shoulder, not elsewhere classified: Secondary | ICD-10-CM | POA: Diagnosis not present

## 2022-03-12 DIAGNOSIS — M25532 Pain in left wrist: Secondary | ICD-10-CM | POA: Diagnosis not present

## 2022-03-13 DIAGNOSIS — M25632 Stiffness of left wrist, not elsewhere classified: Secondary | ICD-10-CM | POA: Diagnosis not present

## 2022-03-13 DIAGNOSIS — M25572 Pain in left ankle and joints of left foot: Secondary | ICD-10-CM | POA: Diagnosis not present

## 2022-03-13 DIAGNOSIS — M25672 Stiffness of left ankle, not elsewhere classified: Secondary | ICD-10-CM | POA: Diagnosis not present

## 2022-03-13 DIAGNOSIS — M25612 Stiffness of left shoulder, not elsewhere classified: Secondary | ICD-10-CM | POA: Diagnosis not present

## 2022-03-13 DIAGNOSIS — M25532 Pain in left wrist: Secondary | ICD-10-CM | POA: Diagnosis not present

## 2022-03-13 DIAGNOSIS — M25512 Pain in left shoulder: Secondary | ICD-10-CM | POA: Diagnosis not present

## 2022-03-13 DIAGNOSIS — Z4789 Encounter for other orthopedic aftercare: Secondary | ICD-10-CM | POA: Diagnosis not present

## 2022-03-25 DIAGNOSIS — M25572 Pain in left ankle and joints of left foot: Secondary | ICD-10-CM | POA: Diagnosis not present

## 2022-03-25 DIAGNOSIS — M25612 Stiffness of left shoulder, not elsewhere classified: Secondary | ICD-10-CM | POA: Diagnosis not present

## 2022-03-25 DIAGNOSIS — M25672 Stiffness of left ankle, not elsewhere classified: Secondary | ICD-10-CM | POA: Diagnosis not present

## 2022-03-25 DIAGNOSIS — M25512 Pain in left shoulder: Secondary | ICD-10-CM | POA: Diagnosis not present

## 2022-03-25 DIAGNOSIS — M25632 Stiffness of left wrist, not elsewhere classified: Secondary | ICD-10-CM | POA: Diagnosis not present

## 2022-03-25 DIAGNOSIS — Z4789 Encounter for other orthopedic aftercare: Secondary | ICD-10-CM | POA: Diagnosis not present

## 2022-03-25 DIAGNOSIS — M25532 Pain in left wrist: Secondary | ICD-10-CM | POA: Diagnosis not present

## 2022-03-27 DIAGNOSIS — M25532 Pain in left wrist: Secondary | ICD-10-CM | POA: Diagnosis not present

## 2022-03-27 DIAGNOSIS — M25672 Stiffness of left ankle, not elsewhere classified: Secondary | ICD-10-CM | POA: Diagnosis not present

## 2022-03-27 DIAGNOSIS — M25632 Stiffness of left wrist, not elsewhere classified: Secondary | ICD-10-CM | POA: Diagnosis not present

## 2022-03-27 DIAGNOSIS — M25512 Pain in left shoulder: Secondary | ICD-10-CM | POA: Diagnosis not present

## 2022-03-27 DIAGNOSIS — M25572 Pain in left ankle and joints of left foot: Secondary | ICD-10-CM | POA: Diagnosis not present

## 2022-03-27 DIAGNOSIS — M25612 Stiffness of left shoulder, not elsewhere classified: Secondary | ICD-10-CM | POA: Diagnosis not present

## 2022-03-27 DIAGNOSIS — Z4789 Encounter for other orthopedic aftercare: Secondary | ICD-10-CM | POA: Diagnosis not present

## 2022-04-01 DIAGNOSIS — M25632 Stiffness of left wrist, not elsewhere classified: Secondary | ICD-10-CM | POA: Diagnosis not present

## 2022-04-01 DIAGNOSIS — M25572 Pain in left ankle and joints of left foot: Secondary | ICD-10-CM | POA: Diagnosis not present

## 2022-04-01 DIAGNOSIS — M25532 Pain in left wrist: Secondary | ICD-10-CM | POA: Diagnosis not present

## 2022-04-01 DIAGNOSIS — M25612 Stiffness of left shoulder, not elsewhere classified: Secondary | ICD-10-CM | POA: Diagnosis not present

## 2022-04-01 DIAGNOSIS — Z4789 Encounter for other orthopedic aftercare: Secondary | ICD-10-CM | POA: Diagnosis not present

## 2022-04-01 DIAGNOSIS — M25672 Stiffness of left ankle, not elsewhere classified: Secondary | ICD-10-CM | POA: Diagnosis not present

## 2022-04-01 DIAGNOSIS — M25512 Pain in left shoulder: Secondary | ICD-10-CM | POA: Diagnosis not present

## 2022-04-02 DIAGNOSIS — R42 Dizziness and giddiness: Secondary | ICD-10-CM | POA: Diagnosis not present

## 2022-04-02 DIAGNOSIS — Z6828 Body mass index (BMI) 28.0-28.9, adult: Secondary | ICD-10-CM | POA: Diagnosis not present

## 2022-04-02 DIAGNOSIS — E7800 Pure hypercholesterolemia, unspecified: Secondary | ICD-10-CM | POA: Diagnosis not present

## 2022-04-15 DIAGNOSIS — R430 Anosmia: Secondary | ICD-10-CM | POA: Diagnosis not present

## 2022-04-16 DIAGNOSIS — S62002D Unspecified fracture of navicular [scaphoid] bone of left wrist, subsequent encounter for fracture with routine healing: Secondary | ICD-10-CM | POA: Diagnosis not present

## 2022-04-16 DIAGNOSIS — S82892D Other fracture of left lower leg, subsequent encounter for closed fracture with routine healing: Secondary | ICD-10-CM | POA: Diagnosis not present

## 2022-04-16 DIAGNOSIS — S42022D Displaced fracture of shaft of left clavicle, subsequent encounter for fracture with routine healing: Secondary | ICD-10-CM | POA: Diagnosis not present

## 2022-04-16 DIAGNOSIS — S93432D Sprain of tibiofibular ligament of left ankle, subsequent encounter: Secondary | ICD-10-CM | POA: Diagnosis not present

## 2022-04-30 DIAGNOSIS — M722 Plantar fascial fibromatosis: Secondary | ICD-10-CM | POA: Diagnosis not present

## 2022-04-30 DIAGNOSIS — M79672 Pain in left foot: Secondary | ICD-10-CM | POA: Diagnosis not present

## 2022-04-30 DIAGNOSIS — M79671 Pain in right foot: Secondary | ICD-10-CM | POA: Diagnosis not present

## 2022-05-14 DIAGNOSIS — Z0000 Encounter for general adult medical examination without abnormal findings: Secondary | ICD-10-CM | POA: Diagnosis not present

## 2022-05-14 DIAGNOSIS — Z6829 Body mass index (BMI) 29.0-29.9, adult: Secondary | ICD-10-CM | POA: Diagnosis not present

## 2022-05-16 DIAGNOSIS — R430 Anosmia: Secondary | ICD-10-CM | POA: Diagnosis not present

## 2022-05-21 NOTE — Progress Notes (Deleted)
Cardiology Office Note:   Date:  05/21/2022  NAME:  Diane Price    MRN: [id number] DOB:  Nov 26, 1955   PCP:  Ileana Ladd, MD (Inactive)  Cardiologist:  None  Electrophysiologist:  None   Referring MD: Ileana Ladd, MD   No chief complaint on file. ***  History of Present Illness:   Diane Price is a 66 y.o. female with a hx of history of coronary calcium who presents for follow-up. Seen 11/2021 for SOB. Echo normal. Elevated coronary calcium score.   Problem List Coronary calcium  -39.8 (69th percentile) 2. T chol 232, HDL 70, LDL 148, TG 87  Past Medical History: Past Medical History:  Diagnosis Date   Arthritis    Generalized pruritus 01/10/2016   Hiatal hernia    History of MRSA infection    12 yrs ago as of 12/2021   HLD (hyperlipidemia)    Lichen planus 01/10/2016   Missed ab    x2   MRSA bacteremia 01/10/2016   hx 12 yrs ago as of 12/2021   Recurrent cellulitis of lower leg 01/10/2016   Ulnar deviation of finger 01/10/2016    Past Surgical History: Past Surgical History:  Procedure Laterality Date   CHOLECYSTECTOMY     colonoscoopy     x several   DILATION AND CURETTAGE OF UTERUS     ORIF ANKLE FRACTURE Left 12/27/2021   Procedure: OPEN REDUCTION INTERNAL FIXATION (ORIF) ANKLE FRACTURE;  Surgeon: Roby Lofts, MD;  Location: [facility] OR;  Service: Orthopedics;  Laterality: Left;   ORIF CLAVICULAR FRACTURE Left 12/27/2021   Procedure: OPEN REDUCTION INTERNAL FIXATION (ORIF) CLAVICULAR FRACTURE;  Surgeon: Roby Lofts, MD;  Location: [facility] OR;  Service: Orthopedics;  Laterality: Left;   WISDOM TOOTH EXTRACTION      Current Medications: No outpatient medications have been marked as taking for the 05/24/22 encounter (Appointment) with O'Neal, Ronnald Ramp, MD.     Allergies:    Patient has no known allergies.   Social History: Social History   Socioeconomic History   Marital status: Married    Spouse name: Not on file   Number of  children: 1   Years of education: Not on file   Highest education level: Not on file  Occupational History   Occupation: Retired  Tobacco Use   Smoking status: Never   Smokeless tobacco: Never  Vaping Use   Vaping Use: Never used  Substance and Sexual Activity   Alcohol use: Yes    Comment: wine 2-3-x week   Drug use: No   Sexual activity: Not on file  Other Topics Concern   Not on file  Social History Narrative   Not on file   Social Determinants of Health   Financial Resource Strain: Not on file  Food Insecurity: Not on file  Transportation Needs: Not on file  Physical Activity: Not on file  Stress: Not on file  Social Connections: Not on file     Family History: The patient's ***family history includes Breast cancer in her maternal aunt and maternal aunt; Heart disease in her father; Hypertension in her mother; Stroke in her mother.  ROS:   All other ROS reviewed and negative. Pertinent positives noted in the HPI.     EKGs/Labs/Other Studies Reviewed:   The following studies were personally reviewed by me today:  EKG:  EKG is *** ordered today.  The ekg ordered today demonstrates ***, and was personally reviewed by me.   TTE 12/17/2021  1. Left ventricular ejection fraction, by estimation, is 65 to 70%. The  left ventricle has normal function. The left ventricle has no regional  wall motion abnormalities. Left ventricular diastolic parameters were  normal.   2. Right ventricular systolic function is normal. The right ventricular  size is normal. Tricuspid regurgitation signal is inadequate for assessing  PA pressure.   3. The mitral valve is grossly normal. Trivial mitral valve  regurgitation. No evidence of mitral stenosis.   4. The aortic valve is tricuspid. There is mild calcification of the  aortic valve. Aortic valve regurgitation is not visualized. Aortic valve  sclerosis is present, with no evidence of aortic valve stenosis.   5. The inferior vena cava  is normal in size with greater than 50%  respiratory variability, suggesting right atrial pressure of 3 mmHg.   Recent Labs: 12/26/2021: ALT 36 12/30/2021: BUN 13; Creatinine, Ser 0.85; Hemoglobin 10.0; Platelets 211; Potassium 4.0; Sodium 141   Recent Lipid Panel No results found for: "CHOL", "TRIG", "HDL", "CHOLHDL", "VLDL", "LDLCALC", "LDLDIRECT"  Physical Exam:   VS:  There were no vitals taken for this visit.   Wt Readings from Last 3 Encounters:  12/27/21 185 lb (83.9 kg)  11/27/21 184 lb 6.4 oz (83.6 kg)  06/18/17 185 lb (83.9 kg)    General: Well nourished, well developed, in no acute distress Head: Atraumatic, normal size  Eyes: PEERLA, EOMI  Neck: Supple, no JVD Endocrine: No thryomegaly Cardiac: Normal S1, S2; RRR; no murmurs, rubs, or gallops Lungs: Clear to auscultation bilaterally, no wheezing, rhonchi or rales  Abd: Soft, nontender, no hepatomegaly  Ext: No edema, pulses 2+ Musculoskeletal: No deformities, BUE and BLE strength normal and equal Skin: Warm and dry, no rashes   Neuro: Alert and oriented to person, place, time, and situation, CNII-XII grossly intact, no focal deficits  Psych: Normal mood and affect   ASSESSMENT:   [name] is a 66 y.o. female who presents for the following: No diagnosis found.  PLAN:   There are no diagnoses linked to this encounter.  {Are you ordering a CV Procedure (e.g. stress test, cath, DCCV, TEE, etc)?   Press F2        :[id number]}  Disposition: No follow-ups on file.  Medication Adjustments/Labs and Tests Ordered: Current medicines are reviewed at length with the patient today.  Concerns regarding medicines are outlined above.  No orders of the defined types were placed in this encounter.  No orders of the defined types were placed in this encounter.   There are no Patient Instructions on file for this visit.   Time Spent with Patient: I have spent a total of *** minutes with patient reviewing hospital  notes, telemetry, EKGs, labs and examining the patient as well as establishing an assessment and plan that was discussed with the patient.  > 50% of time was spent in direct patient care.  Signed, Lenna Gilford. Flora Lipps, MD, [facility]  [facility]  [address] [place], [place] [address] [phone]  05/21/2022 6:36 PM

## 2022-05-24 ENCOUNTER — Ambulatory Visit: Payer: Medicare HMO | Admitting: Cardiovascular Disease

## 2022-05-24 DIAGNOSIS — R0602 Shortness of breath: Secondary | ICD-10-CM

## 2022-05-24 DIAGNOSIS — E782 Mixed hyperlipidemia: Secondary | ICD-10-CM

## 2022-05-24 DIAGNOSIS — R931 Abnormal findings on diagnostic imaging of heart and coronary circulation: Secondary | ICD-10-CM

## 2022-05-27 DIAGNOSIS — S299XXA Unspecified injury of thorax, initial encounter: Secondary | ICD-10-CM | POA: Diagnosis not present

## 2022-06-12 DIAGNOSIS — H2513 Age-related nuclear cataract, bilateral: Secondary | ICD-10-CM | POA: Diagnosis not present

## 2022-06-12 DIAGNOSIS — H43812 Vitreous degeneration, left eye: Secondary | ICD-10-CM | POA: Diagnosis not present

## 2022-06-27 DIAGNOSIS — Z1231 Encounter for screening mammogram for malignant neoplasm of breast: Secondary | ICD-10-CM | POA: Diagnosis not present

## 2022-06-27 DIAGNOSIS — Z124 Encounter for screening for malignant neoplasm of cervix: Secondary | ICD-10-CM | POA: Diagnosis not present

## 2022-06-27 DIAGNOSIS — Z6829 Body mass index (BMI) 29.0-29.9, adult: Secondary | ICD-10-CM | POA: Diagnosis not present

## 2022-06-27 DIAGNOSIS — Z01419 Encounter for gynecological examination (general) (routine) without abnormal findings: Secondary | ICD-10-CM | POA: Diagnosis not present

## 2022-08-20 DIAGNOSIS — R439 Unspecified disturbances of smell and taste: Secondary | ICD-10-CM | POA: Diagnosis not present

## 2022-08-20 DIAGNOSIS — R430 Anosmia: Secondary | ICD-10-CM | POA: Diagnosis not present

## 2022-08-20 DIAGNOSIS — J342 Deviated nasal septum: Secondary | ICD-10-CM | POA: Diagnosis not present

## 2022-08-29 ENCOUNTER — Other Ambulatory Visit: Payer: Self-pay | Admitting: Family Medicine

## 2022-08-29 ENCOUNTER — Ambulatory Visit
Admission: RE | Admit: 2022-08-29 | Discharge: 2022-08-29 | Disposition: A | Discharge status: Home / Self Care | Payer: Medicare HMO | Source: Ambulatory Visit | Attending: Family Medicine | Admitting: Family Medicine

## 2022-08-29 DIAGNOSIS — M545 Low back pain, unspecified: Secondary | ICD-10-CM

## 2022-08-29 DIAGNOSIS — Z6830 Body mass index (BMI) 30.0-30.9, adult: Secondary | ICD-10-CM | POA: Diagnosis not present

## 2022-09-04 DIAGNOSIS — K219 Gastro-esophageal reflux disease without esophagitis: Secondary | ICD-10-CM | POA: Diagnosis not present

## 2022-09-04 DIAGNOSIS — R1311 Dysphagia, oral phase: Secondary | ICD-10-CM | POA: Diagnosis not present

## 2022-11-01 DIAGNOSIS — E785 Hyperlipidemia, unspecified: Secondary | ICD-10-CM | POA: Diagnosis not present

## 2022-11-01 DIAGNOSIS — R030 Elevated blood-pressure reading, without diagnosis of hypertension: Secondary | ICD-10-CM | POA: Diagnosis not present

## 2022-11-01 DIAGNOSIS — I700 Atherosclerosis of aorta: Secondary | ICD-10-CM | POA: Diagnosis not present

## 2022-11-01 DIAGNOSIS — R809 Proteinuria, unspecified: Secondary | ICD-10-CM | POA: Diagnosis not present

## 2022-11-07 ENCOUNTER — Encounter (INDEPENDENT_AMBULATORY_CARE_PROVIDER_SITE_OTHER): Payer: Self-pay | Admitting: Internal Medicine

## 2022-11-07 ENCOUNTER — Ambulatory Visit (INDEPENDENT_AMBULATORY_CARE_PROVIDER_SITE_OTHER): Payer: Medicare HMO | Admitting: Internal Medicine

## 2022-11-07 VITALS — BP 130/75 | HR 78 | Temp 98.0°F | Ht 66.0 in | Wt 187.0 lb

## 2022-11-07 DIAGNOSIS — R809 Proteinuria, unspecified: Secondary | ICD-10-CM

## 2022-11-07 DIAGNOSIS — Z6830 Body mass index (BMI) 30.0-30.9, adult: Secondary | ICD-10-CM

## 2022-11-07 DIAGNOSIS — E7800 Pure hypercholesterolemia, unspecified: Secondary | ICD-10-CM

## 2022-11-07 DIAGNOSIS — E669 Obesity, unspecified: Secondary | ICD-10-CM

## 2022-11-07 NOTE — Assessment & Plan Note (Signed)
We reviewed weight, biometrics, associated medical conditions and contributing factors with patient. She would benefit from weight loss therapy via a modified calorie, low-carb, high-protein nutritional plan tailored to their REE (resting energy expenditure) which will be determined by indirect calorimetry.  We will also assess for cardiometabolic risk and nutritional derangements via fasting serologies at her next appointment. 
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
## 2022-11-07 NOTE — Assessment & Plan Note (Signed)
Will review records.  I was unable to download labs from [facility] during the session.  I asked patient to print out her labs so we could determine degree of proteinuria.  She denies history of diabetes, lupus or hypertension.

## 2022-11-07 NOTE — Assessment & Plan Note (Signed)
   According to records and patient she has elevated cholesterol.  I cannot see her most recent lipid profile.  We will check a fasting lipid profile with intake labs and assess cardiovascular risk.  We will also determine degree of proteinuria to see if she has nephrotic range which may cause elevated cholesterol levels.

## 2023-01-07 DIAGNOSIS — M79671 Pain in right foot: Secondary | ICD-10-CM | POA: Diagnosis not present

## 2023-01-07 DIAGNOSIS — R809 Proteinuria, unspecified: Secondary | ICD-10-CM | POA: Diagnosis not present

## 2023-01-23 DIAGNOSIS — M722 Plantar fascial fibromatosis: Secondary | ICD-10-CM | POA: Diagnosis not present

## 2023-01-23 DIAGNOSIS — M79671 Pain in right foot: Secondary | ICD-10-CM | POA: Diagnosis not present

## 2023-01-23 DIAGNOSIS — M24571 Contracture, right ankle: Secondary | ICD-10-CM | POA: Diagnosis not present

## 2023-05-30 DIAGNOSIS — E559 Vitamin D deficiency, unspecified: Secondary | ICD-10-CM | POA: Diagnosis not present

## 2023-05-30 DIAGNOSIS — E785 Hyperlipidemia, unspecified: Secondary | ICD-10-CM | POA: Diagnosis not present

## 2023-05-30 DIAGNOSIS — I700 Atherosclerosis of aorta: Secondary | ICD-10-CM | POA: Diagnosis not present

## 2023-07-08 ENCOUNTER — Other Ambulatory Visit: Payer: Self-pay | Admitting: Obstetrics and Gynecology

## 2023-07-08 DIAGNOSIS — R928 Other abnormal and inconclusive findings on diagnostic imaging of breast: Secondary | ICD-10-CM

## 2023-07-13 IMAGING — CT CT CARDIAC CORONARY ARTERY CALCIUM SCORE
3 series · 14 of 20 positions shown, 16 images · non-contrast
Comparison: None Available.
COMPARISON: None Available.

Addendum:
EXAM:
OVER-READ INTERPRETATION  CT CHEST

The following report is a limited chest CT over-read performed by
12/17/2021. The calcium score interpretation by the cardiologist is
attached.
TECHNIQUE: A gated, non-contrast computed tomography scan of the heart was
performed using 3mm slice thickness. Axial images were analyzed on a
dedicated workstation. Calcium scoring of the coronary arteries was
performed using the Agatston method.

[Series 2: cascseq 2.0 sa36 (id) (id) · axial · 0.41mm/px · z∈[-242,-162]mm · 4 of 68 slices shown]
[im 14/68  vessel]
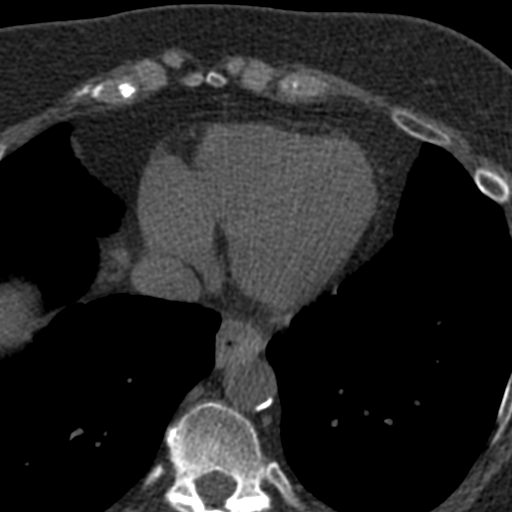
[im 27/68  vessel]
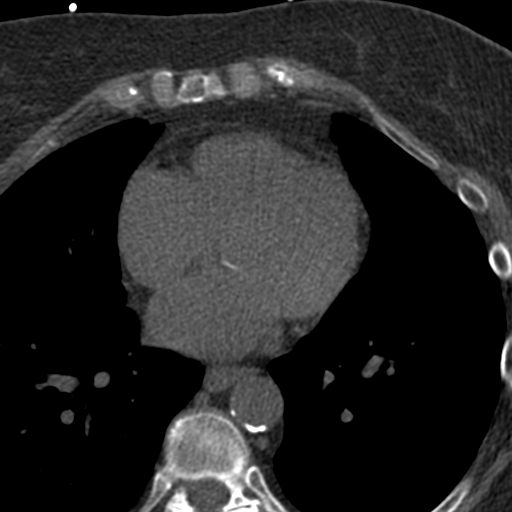
[im 41/68  vessel]
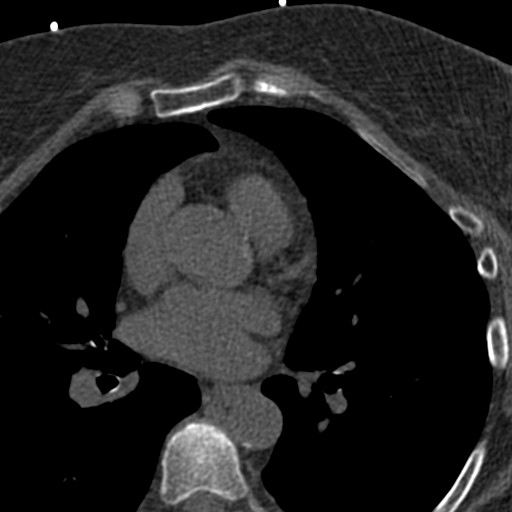
[im 54/68  vessel]
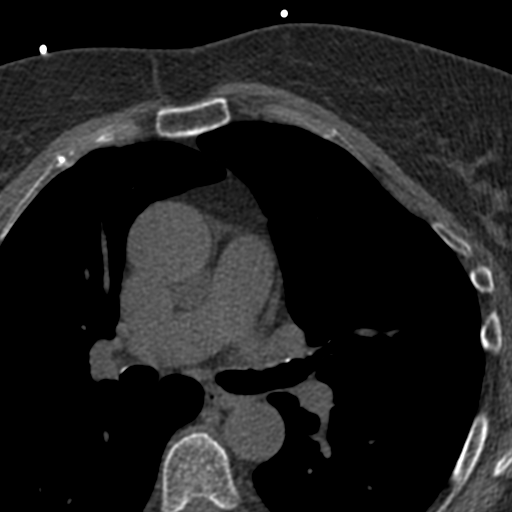

[Series 3: cascseq 2.0 bf37 st · axial · 0.66mm/px · z∈[-246,-158]mm · 5 of 68 slices shown, 7 images]
[im 12/68  vessel]
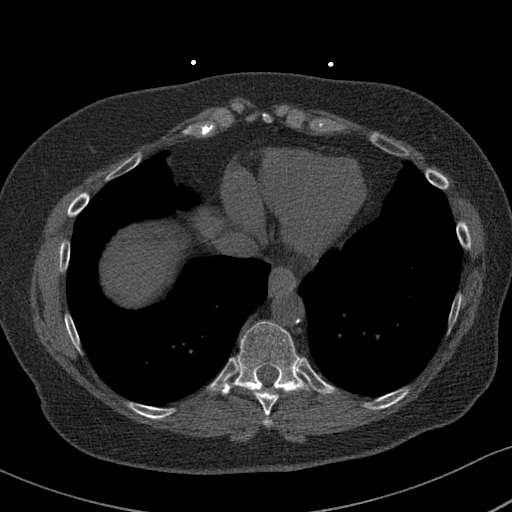
[im 12/68  lung]
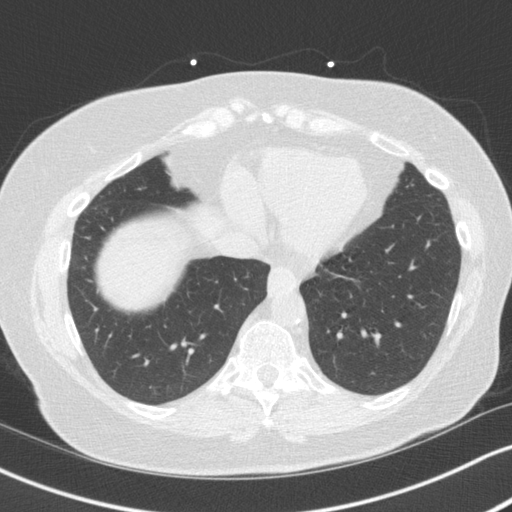
[im 23/68  vessel]
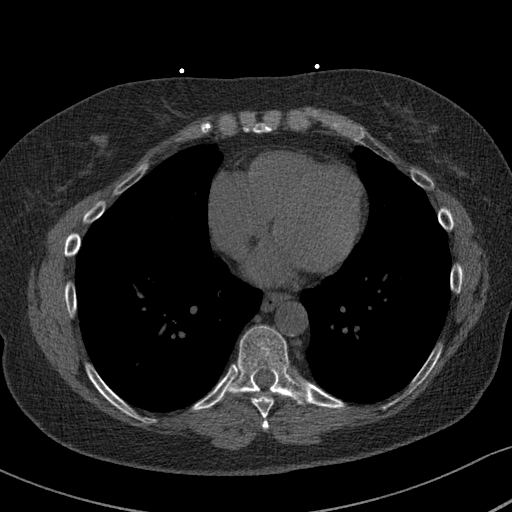
[im 34/68  vessel]
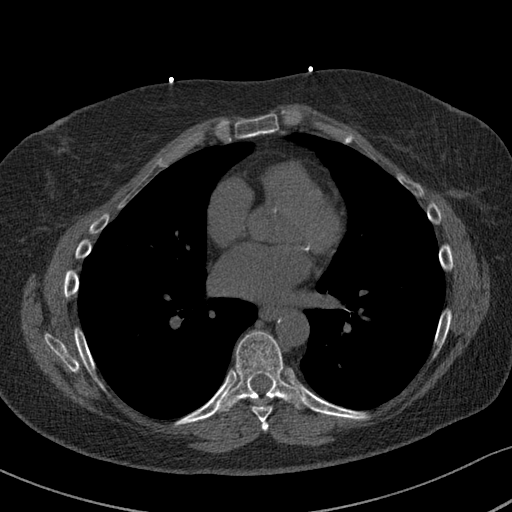
[im 45/68  vessel]
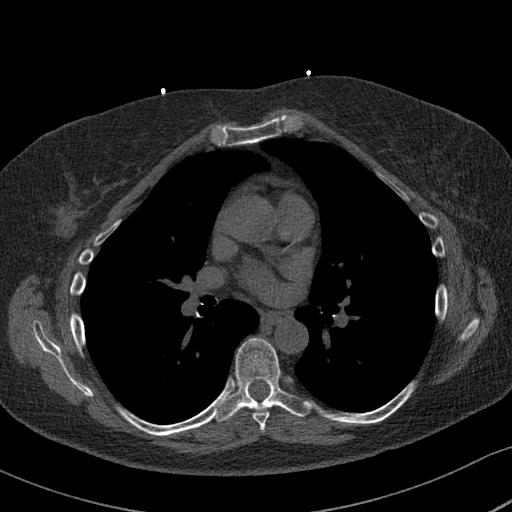
[im 56/68  vessel]
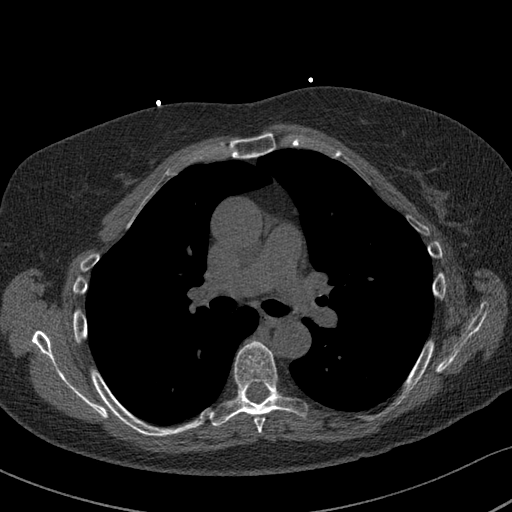
[im 56/68  lung]
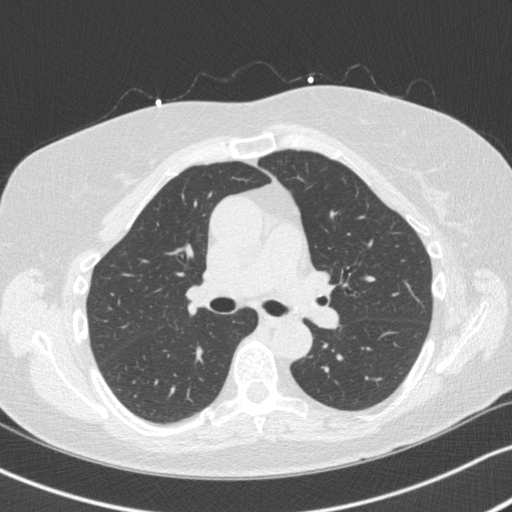

[Series 4: cascseq 2.0 br59 lung · axial · 0.66mm/px · z∈[-246,-158]mm · 5 of 68 slices shown]
[im 12/68  lung]
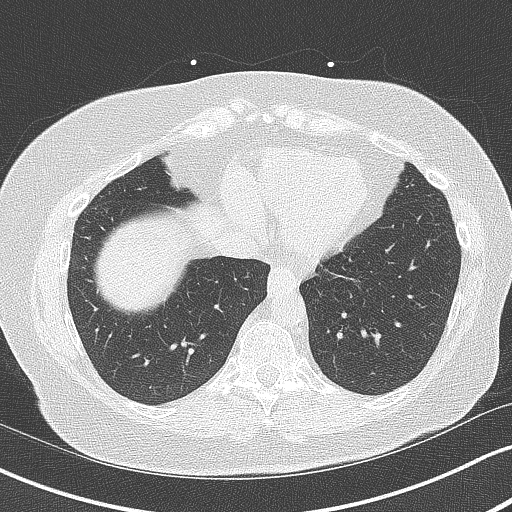
[im 23/68  lung]
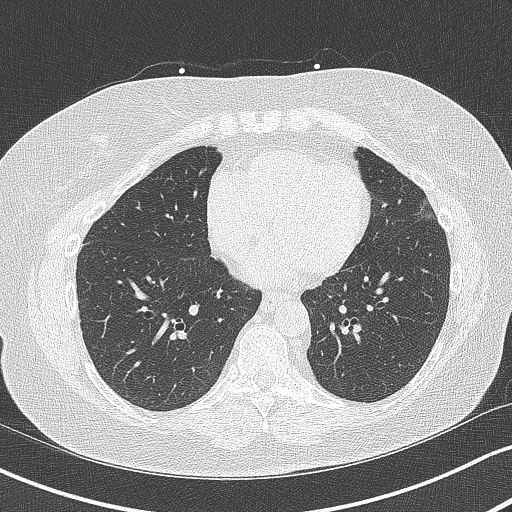
[im 34/68  lung]
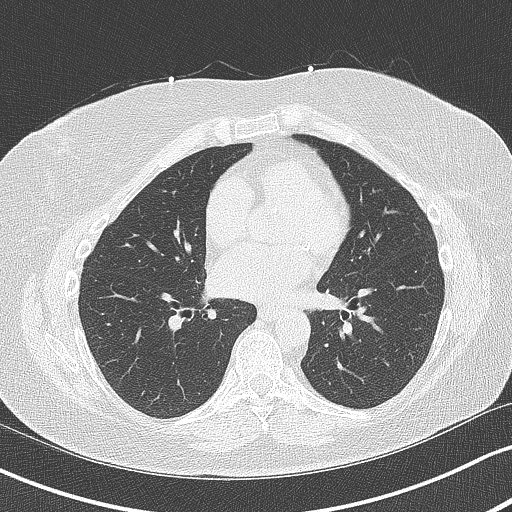
[im 45/68  lung]
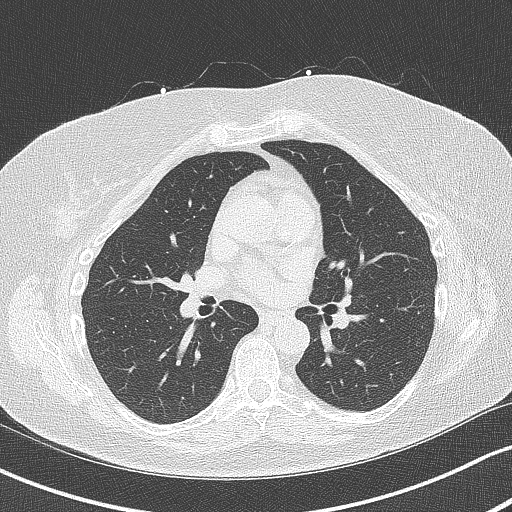
[im 56/68  lung]
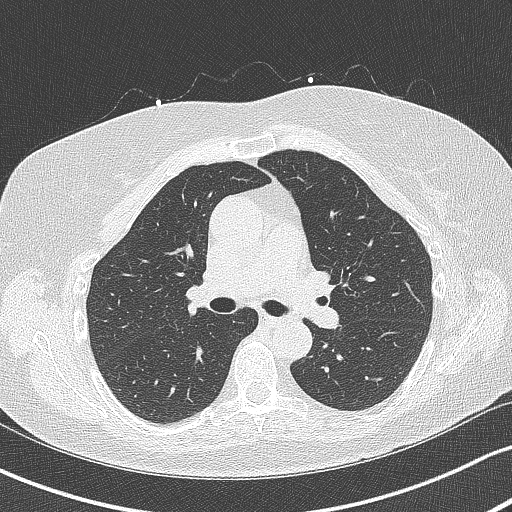

[14 of 20 positions shown; findings below may reference images not displayed]

FINDINGS: Limited view of the lung parenchyma demonstrates very small 2 mm
RIGHT middle lobe pulmonary nodule (image [DATE]). Airways are normal.

Limited view of the mediastinum demonstrates no adenopathy.
Esophagus normal.

Limited view of the upper abdomen unremarkable.

Limited view of the skeleton and chest wall is unremarkable.
IMPRESSION: 2 mm right solid pulmonary nodule. No routine follow-up imaging is
recommended per [HOSPITAL] Guidelines. These guidelines do
not apply to immunocompromised patients and patients with cancer.
Follow up in patients with significant comorbidities as clinically
warranted. For lung cancer screening, adhere to Lung-RADS
guidelines. Reference: Radiology. 6586; 284(1):228-43.

ADDENDUM:
Cardiovascular Disease Risk stratification

EXAM:
Coronary Calcium Score
FINDINGS: Coronary arteries: Normal origins.

Coronary Calcium Score:

Left main: 0

Left anterior descending artery:

Left circumflex artery:

Right coronary artery: 0

Total:

Percentile: 69

Pericardium: Normal.

Ascending Aorta: Normal caliber, with mild aortic root
calcification.

Mild mitral annular calcification.

Non-cardiac: See separate report from [REDACTED].
IMPRESSION: Coronary calcium score of 39.8. This was 69 percentile for age-,
race-, and sex-matched controls.



If CAC=0, it is reasonable to withhold statin therapy and reassess
in 5 to 10 years, as long as higher risk conditions are absent
(diabetes mellitus, family history of premature CHD in first degree
relatives (males <55 years; females <65 years), cigarette smoking,
or LDL >=190 mg/dL).

If CAC is 1 to 99, it is reasonable to initiate statin therapy for
patients >=55 years of age.

If CAC is >=100 or >=75th percentile, it is reasonable to initiate
statin therapy at any age.

Cardiology referral should be considered for patients with CAC
scores >=400 or >=75th percentile.

*1807 AHA/ACC/AACVPR/AAPA/ABC/RASENBERG/DEEQA RAYAAN/ADILJI/Ebadat/RUDI/HEEJIN/EMILSY
Guideline on the Management of Blood Cholesterol: A Report of the
American College of Cardiology/American Heart Association Task Force
on Clinical Practice Guidelines. J Am Coll Cardiol.
5296;73(24):4203-4586.

Mark Erik Saldivar, DO [REDACTED]

The noncardiac portion of this study will be interpreted in separate
report by the radiologist.

*** End of Addendum ***
EXAM:
OVER-READ INTERPRETATION  CT CHEST

The following report is a limited chest CT over-read performed by
12/17/2021. The calcium score interpretation by the cardiologist is
attached.
FINDINGS: Limited view of the lung parenchyma demonstrates very small 2 mm
RIGHT middle lobe pulmonary nodule (image [DATE]). Airways are normal.

Limited view of the mediastinum demonstrates no adenopathy.
Esophagus normal.

Limited view of the upper abdomen unremarkable.

Limited view of the skeleton and chest wall is unremarkable.
IMPRESSION: 2 mm right solid pulmonary nodule. No routine follow-up imaging is
recommended per [HOSPITAL] Guidelines. These guidelines do
not apply to immunocompromised patients and patients with cancer.
Follow up in patients with significant comorbidities as clinically
warranted. For lung cancer screening, adhere to Lung-RADS
guidelines. Reference: Radiology. 6586; 284(1):228-43.

## 2023-07-23 ENCOUNTER — Ambulatory Visit
Admission: RE | Admit: 2023-07-23 | Discharge: 2023-07-23 | Discharge status: Home / Self Care | Payer: Medicare HMO | Source: Ambulatory Visit | Attending: Obstetrics and Gynecology | Admitting: Obstetrics and Gynecology

## 2023-07-23 ENCOUNTER — Ambulatory Visit
Admission: RE | Admit: 2023-07-23 | Discharge: 2023-07-23 | Disposition: A | Discharge status: Home / Self Care | Payer: Medicare HMO | Source: Ambulatory Visit | Attending: Obstetrics and Gynecology | Admitting: Obstetrics and Gynecology

## 2023-07-23 ENCOUNTER — Other Ambulatory Visit: Payer: Medicare HMO

## 2023-07-23 DIAGNOSIS — N6002 Solitary cyst of left breast: Secondary | ICD-10-CM | POA: Diagnosis not present

## 2023-07-23 DIAGNOSIS — N6325 Unspecified lump in the left breast, overlapping quadrants: Secondary | ICD-10-CM | POA: Diagnosis not present

## 2023-07-23 DIAGNOSIS — R928 Other abnormal and inconclusive findings on diagnostic imaging of breast: Secondary | ICD-10-CM

## 2023-11-13 ENCOUNTER — Ambulatory Visit (INDEPENDENT_AMBULATORY_CARE_PROVIDER_SITE_OTHER): Payer: Self-pay | Admitting: Audiology

## 2023-11-13 DIAGNOSIS — H903 Sensorineural hearing loss, bilateral: Secondary | ICD-10-CM

## 2023-11-18 NOTE — Progress Notes (Unsigned)
  [address] [place], [place] [address] [phone]  Hearing Aid Check     Leyton Strain Lynnda Sas comes for a scheduled appointment for a hearing aid check.  Time in:*** Time out:*** Accompanied by:***   Right Left  Hearing aid manufacturer    Hearing aid style    Hearing aid battery    Receiver    Dome/ custom earpiece    Retention wire    Warranty expiration date    Loss and Damage    Additional accessories Expiration date    Initial fitting date    Device was fit at:    Hearing aid services Bundled until:  Bundled until:     Chief complaint: Patient reports ***.  Actions taken: Inspection of the device and listening check showed that ***.  Services fee: $*** was paid at checkout.  Patient was oriented about ***.  Recommend: Return for a hearing aid check ***. Return for a hearing evaluation and to see an ENT, if concerns with hearing changes arise. ***  Roselani Grajeda MARIE LEROUX-MARTINEZ, AUD

## 2023-12-31 DIAGNOSIS — K5901 Slow transit constipation: Secondary | ICD-10-CM | POA: Diagnosis not present

## 2023-12-31 DIAGNOSIS — K449 Diaphragmatic hernia without obstruction or gangrene: Secondary | ICD-10-CM | POA: Diagnosis not present

## 2024-04-01 DIAGNOSIS — K219 Gastro-esophageal reflux disease without esophagitis: Secondary | ICD-10-CM | POA: Diagnosis not present

## 2024-04-01 DIAGNOSIS — K222 Esophageal obstruction: Secondary | ICD-10-CM | POA: Diagnosis not present

## 2024-04-01 DIAGNOSIS — K2100 Gastro-esophageal reflux disease with esophagitis, without bleeding: Secondary | ICD-10-CM | POA: Diagnosis not present

## 2024-04-01 DIAGNOSIS — R1310 Dysphagia, unspecified: Secondary | ICD-10-CM | POA: Diagnosis not present

## 2024-04-01 DIAGNOSIS — R1013 Epigastric pain: Secondary | ICD-10-CM | POA: Diagnosis not present

## 2024-04-01 DIAGNOSIS — K449 Diaphragmatic hernia without obstruction or gangrene: Secondary | ICD-10-CM | POA: Diagnosis not present

## 2024-04-01 DIAGNOSIS — K2289 Other specified disease of esophagus: Secondary | ICD-10-CM | POA: Diagnosis not present

## 2024-04-01 DIAGNOSIS — K317 Polyp of stomach and duodenum: Secondary | ICD-10-CM | POA: Diagnosis not present

## 2024-04-05 DIAGNOSIS — K317 Polyp of stomach and duodenum: Secondary | ICD-10-CM | POA: Diagnosis not present

## 2024-04-05 DIAGNOSIS — K2100 Gastro-esophageal reflux disease with esophagitis, without bleeding: Secondary | ICD-10-CM | POA: Diagnosis not present

## 2024-04-08 DIAGNOSIS — L900 Lichen sclerosus et atrophicus: Secondary | ICD-10-CM | POA: Diagnosis not present

## 2024-05-14 DIAGNOSIS — N3281 Overactive bladder: Secondary | ICD-10-CM | POA: Diagnosis not present

## 2024-05-14 DIAGNOSIS — E559 Vitamin D deficiency, unspecified: Secondary | ICD-10-CM | POA: Diagnosis not present

## 2024-05-14 DIAGNOSIS — E785 Hyperlipidemia, unspecified: Secondary | ICD-10-CM | POA: Diagnosis not present

## 2024-05-14 DIAGNOSIS — K2100 Gastro-esophageal reflux disease with esophagitis, without bleeding: Secondary | ICD-10-CM | POA: Diagnosis not present

## 2024-05-14 DIAGNOSIS — Z0000 Encounter for general adult medical examination without abnormal findings: Secondary | ICD-10-CM | POA: Diagnosis not present

## 2024-05-19 DIAGNOSIS — L900 Lichen sclerosus et atrophicus: Secondary | ICD-10-CM | POA: Diagnosis not present
